# Patient Record
Sex: Male | Born: 1990 | Race: White | Hispanic: No | Marital: Married | Smoking: Current every day smoker
Health system: Southern US, Community
[De-identification: ages and names within clinical notes are randomized; demographics above are authoritative.]

## PROBLEM LIST (undated history)

## (undated) HISTORY — PX: TONSILLECTOMY AND ADENOIDECTOMY: SUR1326

---
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
## 2012-07-26 ENCOUNTER — Emergency Department: Payer: Self-pay | Admitting: Emergency Medicine

## 2012-07-26 LAB — COMPREHENSIVE METABOLIC PANEL
Alkaline Phosphatase: 71 U/L (ref 50–136)
Anion Gap: 5 — ABNORMAL LOW (ref 7–16)
BUN: 14 mg/dL (ref 7–18)
Bilirubin,Total: 0.4 mg/dL (ref 0.2–1.0)
Chloride: 105 mmol/L (ref 98–107)
Co2: 29 mmol/L (ref 21–32)
Creatinine: 0.97 mg/dL (ref 0.60–1.30)
EGFR (African American): >60
EGFR (Non-African Amer.): >60
SGPT (ALT): 47 U/L (ref 12–78)

## 2012-07-26 LAB — CBC
HCT: 44.5 % (ref 40.0–52.0)
MCH: 31.8 pg (ref 26.0–34.0)
MCHC: 36.1 g/dL — ABNORMAL HIGH (ref 32.0–36.0)
MCV: 88 fL (ref 80–100)
Platelet: 171 10*3/uL (ref 150–440)
RBC: 5.05 10*6/uL (ref 4.40–5.90)
RDW: 12.7 % (ref 11.5–14.5)

## 2014-09-21 ENCOUNTER — Ambulatory Visit: Payer: Self-pay | Admitting: Family Medicine

## 2016-01-19 ENCOUNTER — Encounter: Payer: Self-pay | Admitting: Family Medicine

## 2016-01-19 ENCOUNTER — Ambulatory Visit (INDEPENDENT_AMBULATORY_CARE_PROVIDER_SITE_OTHER): Payer: BLUE CROSS/BLUE SHIELD | Admitting: Family Medicine

## 2016-01-19 VITALS — BP 124/88 | HR 90 | Temp 99.1°F | Resp 18 | Wt 290.0 lb

## 2016-01-19 DIAGNOSIS — R05 Cough: Secondary | ICD-10-CM

## 2016-01-19 DIAGNOSIS — R12 Heartburn: Secondary | ICD-10-CM | POA: Insufficient documentation

## 2016-01-19 DIAGNOSIS — R110 Nausea: Secondary | ICD-10-CM

## 2016-01-19 DIAGNOSIS — R509 Fever, unspecified: Secondary | ICD-10-CM | POA: Diagnosis not present

## 2016-01-19 DIAGNOSIS — G4710 Hypersomnia, unspecified: Secondary | ICD-10-CM | POA: Insufficient documentation

## 2016-01-19 DIAGNOSIS — R1013 Epigastric pain: Secondary | ICD-10-CM | POA: Insufficient documentation

## 2016-01-19 DIAGNOSIS — R059 Cough, unspecified: Secondary | ICD-10-CM

## 2016-01-19 DIAGNOSIS — R454 Irritability and anger: Secondary | ICD-10-CM | POA: Insufficient documentation

## 2016-01-19 DIAGNOSIS — Z720 Tobacco use: Secondary | ICD-10-CM | POA: Insufficient documentation

## 2016-01-19 MED ORDER — PROMETHAZINE-DM 6.25-15 MG/5ML PO SYRP: 5.0000 mL | ORAL_SOLUTION | Freq: Four times a day (QID) | ORAL | Status: DC | PRN

## 2016-01-19 MED ORDER — OSELTAMIVIR PHOSPHATE 75 MG PO CAPS: 75.0000 mg | ORAL_CAPSULE | Freq: Two times a day (BID) | ORAL | Status: DC

## 2016-01-19 NOTE — Patient Instructions (Signed)

## 2016-01-19 NOTE — Progress Notes (Signed)
Patient ID: Cameron Guzman, male   DOB: [birth year], 25 y.o.   MRN: [id number]       Patient: Cameron Guzman Male    DOB: 03/31/1991   25 y.o.   MRN: [id number] Visit Date: 01/19/2016  Today's Provider: Vernie Murders, PA   Chief Complaint  Patient presents with  . Fever  . Generalized Body Aches   Subjective:    HPI  Patient states he has not felt good for 2 days now. Symptoms include: sore throat, headache, fever of 102 at home, body aches, nausea. He has been around someone who has the flu yesterday.    Patient Active Problem List   Diagnosis Date Noted  . Heart burn 01/19/2016  . Hypersomnolence 01/19/2016  . Dyspepsia 01/19/2016  . Irritability and anger 01/19/2016  . Tobacco use 01/19/2016   Past Surgical History  Procedure Laterality Date  . Tonsillectomy and adenoidectomy     Family History  Problem Relation Age of Onset  . Anxiety disorder Mother   . Depression Mother   . Heart disease Father   . Hypertension Father     No Known Allergies   Previous Medications   No medications on file   Review of Systems  Constitutional: Positive for fever and fatigue.  HENT: Positive for congestion, sore throat and voice change.   Respiratory: Positive for cough.   Gastrointestinal: Positive for nausea. Negative for vomiting and diarrhea.  Neurological: Positive for headaches.   Social History  Substance Use Topics  . Smoking status: Current Every Day Smoker -- 1.00 packs/day for 13 years    Types: Cigarettes  . Smokeless tobacco: Never Used  . Alcohol Use: No   Objective:   BP 124/88 mmHg  Pulse 90  Temp(Src) 99.1 F (37.3 C)  Resp 18  Wt 290 lb (131.543 kg)  SpO2 98%  Physical Exam  Constitutional: He is oriented to person, place, and time. He appears well-developed and well-nourished. No distress.  HENT:  Head: Normocephalic and atraumatic.  Right Ear: Hearing and external ear normal.  Left Ear: Hearing and external ear normal.  Nose: Nose  normal.  Mouth/Throat: Oropharynx is clear and moist.  Eyes: Conjunctivae, EOM and lids are normal. Right eye exhibits no discharge. Left eye exhibits no discharge. No scleral icterus.  Neck: Normal range of motion. Neck supple.  Cardiovascular: Normal rate, regular rhythm and normal heart sounds.   Pulmonary/Chest: Effort normal and breath sounds normal. No respiratory distress.  Abdominal: Soft. Bowel sounds are normal.  Musculoskeletal: Normal range of motion.  Neurological: He is alert and oriented to person, place, and time.  Skin: Skin is intact. No lesion and no rash noted.  Psychiatric: He has a normal mood and affect. His speech is normal and behavior is normal. Thought content normal.      Assessment & Plan:      1. Other specified fever Onset over the past couple days. Very concerned about exposing daughter to this (30 1/2 year old with history of RSV hospitalization last year). Flu test negative. With exposure to someone with the influenza yesterday, will give Tamiflu. Encouraged to drink extra fluids, use Tylenol or Advil prn and home to rest. Recheck prn. - POCT Influenza A/B - oseltamivir (TAMIFLU) 75 MG capsule; Take 1 capsule (75 mg total) by mouth 2 (two) times daily.  Dispense: 10 capsule; Refill: 0  2. Nausea No vomiting but some cramps and nausea this morning. No diarrhea.  3. Cough Mild cough today without  sputum production. Some scratchy sore throat. Will treat with cough syrup and recheck as needed. - promethazine-dextromethorphan (PROMETHAZINE-DM) 6.25-15 MG/5ML syrup; Take 5 mLs by mouth 4 (four) times daily as needed for cough.  Dispense: 118 mL; Refill: 0       Vernie Murders, PA  [facility]

## 2016-08-16 ENCOUNTER — Emergency Department
Admission: EM | Admit: 2016-08-16 | Discharge: 2016-08-16 | Disposition: A | Discharge status: Home / Self Care | Payer: Medicaid Other | Attending: Emergency Medicine | Admitting: Emergency Medicine

## 2016-08-16 ENCOUNTER — Telehealth: Payer: Self-pay | Admitting: Physician Assistant

## 2016-08-16 ENCOUNTER — Encounter: Payer: Self-pay | Admitting: Emergency Medicine

## 2016-08-16 ENCOUNTER — Emergency Department: Payer: Medicaid Other

## 2016-08-16 DIAGNOSIS — R197 Diarrhea, unspecified: Secondary | ICD-10-CM | POA: Diagnosis not present

## 2016-08-16 DIAGNOSIS — R21 Rash and other nonspecific skin eruption: Secondary | ICD-10-CM | POA: Insufficient documentation

## 2016-08-16 DIAGNOSIS — Z79899 Other long term (current) drug therapy: Secondary | ICD-10-CM | POA: Diagnosis not present

## 2016-08-16 DIAGNOSIS — R1084 Generalized abdominal pain: Secondary | ICD-10-CM | POA: Diagnosis not present

## 2016-08-16 DIAGNOSIS — R112 Nausea with vomiting, unspecified: Secondary | ICD-10-CM | POA: Diagnosis not present

## 2016-08-16 DIAGNOSIS — F17210 Nicotine dependence, cigarettes, uncomplicated: Secondary | ICD-10-CM | POA: Diagnosis not present

## 2016-08-16 LAB — COMPREHENSIVE METABOLIC PANEL
ALK PHOS: 52 U/L (ref 38–126)
ALT: 67 U/L — AB (ref 17–63)
AST: 33 U/L (ref 15–41)
Albumin: 4.1 g/dL (ref 3.5–5.0)
Anion gap: 6 (ref 5–15)
BILIRUBIN TOTAL: 0.4 mg/dL (ref 0.3–1.2)
BUN: 17 mg/dL (ref 6–20)
CALCIUM: 9.6 mg/dL (ref 8.9–10.3)
CO2: 22 mmol/L (ref 22–32)
CREATININE: 0.89 mg/dL (ref 0.61–1.24)
Chloride: 107 mmol/L (ref 101–111)
Glucose, Bld: 115 mg/dL — ABNORMAL HIGH (ref 65–99)
Potassium: 4.1 mmol/L (ref 3.5–5.1)
SODIUM: 135 mmol/L (ref 135–145)
Total Protein: 7.3 g/dL (ref 6.5–8.1)

## 2016-08-16 LAB — URINALYSIS COMPLETE WITH MICROSCOPIC (ARMC ONLY)
Bilirubin Urine: NEGATIVE
Glucose, UA: NEGATIVE mg/dL
KETONES UR: NEGATIVE mg/dL
Leukocytes, UA: NEGATIVE
Nitrite: NEGATIVE
PROTEIN: NEGATIVE mg/dL
Specific Gravity, Urine: 1.02 (ref 1.005–1.030)
Squamous Epithelial / LPF: NONE SEEN
pH: 5 (ref 5.0–8.0)

## 2016-08-16 LAB — LIPASE, BLOOD: Lipase: 13 U/L (ref 11–51)

## 2016-08-16 LAB — CBC WITH DIFFERENTIAL/PLATELET
BASOS PCT: 0 %
Basophils Absolute: 0.1 10*3/uL (ref 0–0.1)
EOS ABS: 0.1 10*3/uL (ref 0–0.7)
Eosinophils Relative: 1 %
HCT: 47.9 % (ref 40.0–52.0)
Hemoglobin: 16.7 g/dL (ref 13.0–18.0)
Lymphocytes Relative: 10 %
Lymphs Abs: 2.2 10*3/uL (ref 1.0–3.6)
MCH: 30.1 pg (ref 26.0–34.0)
MCHC: 34.9 g/dL (ref 32.0–36.0)
MCV: 86.3 fL (ref 80.0–100.0)
MONO ABS: 0.9 10*3/uL (ref 0.2–1.0)
Monocytes Relative: 4 %
Neutro Abs: 18.6 10*3/uL — ABNORMAL HIGH (ref 1.4–6.5)
Neutrophils Relative %: 85 %
PLATELETS: 216 10*3/uL (ref 150–440)
RBC: 5.55 MIL/uL (ref 4.40–5.90)
RDW: 12.7 % (ref 11.5–14.5)
WBC: 21.8 10*3/uL — ABNORMAL HIGH (ref 3.8–10.6)

## 2016-08-16 MED ORDER — ONDANSETRON HCL 4 MG/2ML IJ SOLN
4.0000 mg | Freq: Once | INTRAMUSCULAR | Status: AC
  Administered 2016-08-16: 4 mg via INTRAVENOUS
  Filled 2016-08-16: qty 2

## 2016-08-16 MED ORDER — IOPAMIDOL (ISOVUE-300) INJECTION 61%
30.0000 mL | Freq: Once | INTRAVENOUS | Status: AC | PRN
  Administered 2016-08-16: 30 mL via ORAL

## 2016-08-16 MED ORDER — DIPHENHYDRAMINE HCL 50 MG/ML IJ SOLN
25.0000 mg | Freq: Once | INTRAMUSCULAR | Status: AC
  Administered 2016-08-16: 25 mg via INTRAVENOUS
  Filled 2016-08-16: qty 1

## 2016-08-16 MED ORDER — PREDNISONE 50 MG PO TABS: 50.0000 mg | ORAL_TABLET | Freq: Every day | ORAL | 0 refills | Status: DC

## 2016-08-16 MED ORDER — METHYLPREDNISOLONE SODIUM SUCC 125 MG IJ SOLR
125.0000 mg | Freq: Once | INTRAMUSCULAR | Status: AC
  Administered 2016-08-16: 125 mg via INTRAVENOUS
  Filled 2016-08-16: qty 2

## 2016-08-16 MED ORDER — ONDANSETRON 4 MG PO TBDP: ORAL_TABLET | ORAL | 0 refills | Status: AC

## 2016-08-16 MED ORDER — IOPAMIDOL (ISOVUE-300) INJECTION 61%
100.0000 mL | Freq: Once | INTRAVENOUS | Status: AC | PRN
  Administered 2016-08-16: 100 mL via INTRAVENOUS

## 2016-08-16 NOTE — Discharge Instructions (Signed)
Stay hydrated.  Take zofran as needed for nausea.   Take prednisone 50 mg daily for 5 days.   Continue benadryl as needed for itchiness from the rash   See your doctor  Return to ER if you have worse abdominal pain, vomiting, fevers, dehydration, rash, trouble breathing

## 2016-08-16 NOTE — ED Provider Notes (Signed)
[facility] Provider Note   CSN: [id number] Arrival date & time: 08/16/16  0708     History   Chief Complaint Chief Complaint  Patient presents with  . Rash  . Abdominal Pain  . Emesis    HPI Cameron Guzman is a 25 y.o. male otherwise healthy here with rash, abdominal pain. He states that he had an episode of rash about a week ago that resolved with Benadryl. Woke up last night around 2 AM and developed rash starting from his toes now spreads throughout his body. So states that his fingers are more swollen as well as he has some lip swelling. His of subjective shortness of breath as well but denies any chest pain or any trouble swallowing or trouble speaking. He states that he had some shrimp yesterday but he had seafood previously with no reaction. Denies any new meds or new shampoos or detergents. Denies any sick contacts. Denies any recent travel. This morning had some diffuse abdominal pain and an episode of vomiting and diarrhea   The history is provided by the patient.    History reviewed. No pertinent past medical history.  Patient Active Problem List   Diagnosis Date Noted  . Heart burn 01/19/2016  . Hypersomnolence 01/19/2016  . Dyspepsia 01/19/2016  . Irritability and anger 01/19/2016  . Tobacco use 01/19/2016    Past Surgical History:  Procedure Laterality Date  . TONSILLECTOMY AND ADENOIDECTOMY         Home Medications    Prior to Admission medications   Medication Sig Start Date End Date Taking? Authorizing Provider  oseltamivir (TAMIFLU) 75 MG capsule Take 1 capsule (75 mg total) by mouth 2 (two) times daily. 01/19/16   Vickki Muff Chrismon, PA  promethazine-dextromethorphan (PROMETHAZINE-DM) 6.25-15 MG/5ML syrup Take 5 mLs by mouth 4 (four) times daily as needed for cough. 01/19/16   Margo Common, PA    Family History Family History  Problem Relation Age of Onset  . Anxiety disorder Mother   . Depression Mother   . Heart disease  Father   . Hypertension Father     Social History Social History  Substance Use Topics  . Smoking status: Current Every Guzman Smoker    Packs/Guzman: 1.00    Years: 13.00    Types: Cigarettes  . Smokeless tobacco: Never Used  . Alcohol use No     Allergies   Review of patient's allergies indicates no known allergies.   Review of Systems Review of Systems  Gastrointestinal: Positive for abdominal pain, diarrhea, nausea and vomiting.  Skin: Positive for rash.  All other systems reviewed and are negative.    Physical Exam Updated Vital Signs BP 116/62   Pulse 81   Temp 98.1 F (36.7 C) (Oral)   Resp (!) 26   Ht 6' (1.829 m)   Wt 280 lb (127 kg)   SpO2 94%   BMI 37.97 kg/m   Physical Exam  Constitutional: He is oriented to person, place, and time.  Slightly anxious   HENT:  Head: Normocephalic.  No obvious lip or tongue swelling, posterior pharynx nl   Eyes: EOM are normal. Pupils are equal, round, and reactive to light.  Neck: Normal range of motion. Neck supple.  No stridor   Cardiovascular: Normal rate, regular rhythm and normal heart sounds.   Pulmonary/Chest: Effort normal and breath sounds normal. No respiratory distress. He has no wheezes.  Abdominal: Soft. Bowel sounds are normal. He exhibits no distension. There is no  tenderness.  Musculoskeletal: Normal range of motion.  Neurological: He is alert and oriented to person, place, and time.  Skin:  Faint macular papular rash on torso and extremities. No obvious petechiae or purpura.   Nursing note and vitals reviewed.    ED Treatments / Results  Labs (all labs ordered are listed, but only abnormal results are displayed) Labs Reviewed  CBC WITH DIFFERENTIAL/PLATELET - Abnormal; Notable for the following:       Result Value   WBC 21.8 (*)    Neutro Abs 18.6 (*)    All other components within normal limits  COMPREHENSIVE METABOLIC PANEL - Abnormal; Notable for the following:    Glucose, Bld 115 (*)     ALT 67 (*)    All other components within normal limits  URINALYSIS COMPLETEWITH MICROSCOPIC ([facility] ONLY) - Abnormal; Notable for the following:    Color, Urine YELLOW (*)    APPearance CLEAR (*)    Hgb urine dipstick 1+ (*)    Bacteria, UA RARE (*)    All other components within normal limits  LIPASE, BLOOD    EKG  EKG Interpretation None      .ED ECG REPORT I, Wandra Arthurs, the attending physician, personally viewed and interpreted this ECG.   Date: 08/16/2016  EKG Time: 7:47 am  Rate: 90  Rhythm: normal EKG, normal sinus rhythm, unchanged from previous tracings  Axis: normal  Intervals:none  ST&T Change: nonspecific   Radiology No results found.  Procedures Procedures (including critical care time)  Medications Ordered in ED Medications  ondansetron (ZOFRAN) injection 4 mg (4 mg Intravenous Given 08/16/16 0735)  methylPREDNISolone sodium succinate (SOLU-MEDROL) 125 mg/2 mL injection 125 mg (125 mg Intravenous Given 08/16/16 0735)  diphenhydrAMINE (BENADRYL) injection 25 mg (25 mg Intravenous Given 08/16/16 0735)  iopamidol (ISOVUE-300) 61 % injection 30 mL (30 mLs Oral Contrast Given 08/16/16 1010)  iopamidol (ISOVUE-300) 61 % injection 100 mL (100 mLs Intravenous Contrast Given 08/16/16 1054)     Initial Impression / Assessment and Plan / ED Course  I have reviewed the triage vital signs and the nursing notes.  Pertinent labs & imaging results that were available during my care of the patient were reviewed by me and considered in my medical decision making (see chart for details).  Clinical Course  Cameron Guzman is a 25 y.o. male here with rash, vomiting, diarrhea. I think likely gastro. Rash can be associated with vs viral [place]. Has no signs of anaphylaxis. Will check labs, UA. Will give steroids, benadryl. Will reassess.   12:50 PM WBC 21. Mild diffuse tenderness, no rebound. CT showed no acute changes, has some hepatic steatosis. The radiology read is  visible in PACS but didn't cross over to [facility] but I was able to review the read and the images. Pain improved. Rash improved. Likely viral gastro with rash. Since steroids helped with rash, will give course of steroids, prn zofran for nausea.   Final Clinical Impressions(s) / ED Diagnoses   Final diagnoses:  None    New Prescriptions New Prescriptions   No medications on file     Drenda Freeze, MD 08/16/16 1252

## 2016-08-16 NOTE — ED Notes (Signed)
Pt's wife updated about waiting for ct results, cont to monitor

## 2016-08-16 NOTE — ED Notes (Signed)
Pt is drinking the ct contrast, states that "it tastes terrible" no distress noted, cont to monitor

## 2016-08-16 NOTE — Telephone Encounter (Signed)
Pt is being seen in the ER now for a rash, etc.    His mother Jowel Waltner, who is now your patient) just called stating the nurses have told him he needed to have a sleep study done because when he is falling asleep in the ER his "respiratory stats are dropping" and making the machine alerts go off.   He has not been to our office in 4 yrs or so.  Will you see him to get him re-established so he can have a sleep study done?

## 2016-08-16 NOTE — Telephone Encounter (Signed)
Yes he can be scheduled at my next open NP/CPE slot

## 2016-08-16 NOTE — ED Triage Notes (Signed)
Pt reports woke up with rash this AM. Also c/o generalized abdominal pain and vomiting. Reports he feels short of breath.

## 2016-08-16 NOTE — ED Notes (Signed)
Pt states that he never was able to sleep last night, pt states he was itching all over, took a benadryl at 0200 for itching and hives he has all over

## 2016-08-16 NOTE — ED Notes (Signed)
Pt resting quietly, no distress, cont to monitor, Dr Darl Householder notified pt hoping for an update soon

## 2016-08-19 ENCOUNTER — Ambulatory Visit: Payer: BLUE CROSS/BLUE SHIELD | Admitting: Family Medicine

## 2016-08-19 DIAGNOSIS — F17200 Nicotine dependence, unspecified, uncomplicated: Secondary | ICD-10-CM | POA: Insufficient documentation

## 2016-08-19 DIAGNOSIS — R0683 Snoring: Secondary | ICD-10-CM | POA: Insufficient documentation

## 2016-08-26 ENCOUNTER — Ambulatory Visit (INDEPENDENT_AMBULATORY_CARE_PROVIDER_SITE_OTHER): Payer: Medicaid Other | Admitting: Physician Assistant

## 2016-08-26 ENCOUNTER — Encounter: Payer: Self-pay | Admitting: Physician Assistant

## 2016-08-26 VITALS — BP 118/78 | HR 82 | Temp 98.2°F | Resp 16 | Wt 286.2 lb

## 2016-08-26 DIAGNOSIS — D72829 Elevated white blood cell count, unspecified: Secondary | ICD-10-CM

## 2016-08-26 DIAGNOSIS — Z23 Encounter for immunization: Secondary | ICD-10-CM | POA: Diagnosis not present

## 2016-08-26 DIAGNOSIS — T7840XA Allergy, unspecified, initial encounter: Secondary | ICD-10-CM

## 2016-08-26 DIAGNOSIS — R0683 Snoring: Secondary | ICD-10-CM

## 2016-08-26 DIAGNOSIS — R400 Somnolence: Secondary | ICD-10-CM | POA: Diagnosis not present

## 2016-08-26 DIAGNOSIS — Z6838 Body mass index (BMI) 38.0-38.9, adult: Secondary | ICD-10-CM | POA: Diagnosis not present

## 2016-08-26 NOTE — Progress Notes (Signed)
Patient: Cameron Guzman Male    DOB: [birth year]   25 y.o.   MRN: [id number] Visit Date: 08/26/2016  Today's Provider: Mar Daring, PA-C   Chief Complaint  Patient presents with  . Sleep Apnea   Subjective:    HPI Patient is here with c/o sleep apnea. He reports he had a sleep study in the past. He gets up to gasp for air. Snores a lot. He was just at the hospital for other complain and he reports his wife and mother notice his breathing was changing while he was sleeping. No hx of asthma. He has had one sleep study in the past that was inconclusive because he could not sleep well. His wife reports that this was about 1.5 years ago. He snores very loudly (lives in townhome and neighbors hit the wall because of his snoring), wife reports witnessed apneas, stating she notices him not breathing and she will nudge him. He also states he sleeps very hard now and has difficulty waking. He has 3 alarms set that go off every 5 min that he sleeps through. He also has had increasing daytime somnolence over the past 6 months. He reports if he is sitting still he can fall alseep even if someone is talking to him. He has had tonsillectomy and adenoidectomy.  He wants a referral to allergist. He thinks he may have a red meat allergy from tick bite. He states that a few weeks ago he had red meat often for dinner and he started waking up in the middle of the night with a pruritic rash, but he would take benadryl and it would improve. Finally on the night/morning of 08/16/16 he developed a rash that did not respond to benadryl. He went to the ER. He was told he may be having an allergic reaction vs viral gastroenteritis with viral exanthem. Patient has avoided red meat since and has not experienced these symptoms since then.     No Known Allergies   Current Outpatient Prescriptions:  .  ondansetron (ZOFRAN ODT) 4 MG disintegrating tablet, 43m ODT q4 hours prn nausea/vomit (Patient not taking:  Reported on 08/26/2016), Disp: 8 tablet, Rfl: 0 .  predniSONE (DELTASONE) 50 MG tablet, Take 50 mg by mouth daily., Disp: , Rfl: 0  Review of Systems  Constitutional: Positive for fatigue.  HENT: Negative for congestion, nosebleeds, postnasal drip, rhinorrhea, sinus pressure, sneezing, sore throat and trouble swallowing.   Respiratory: Positive for apnea. Negative for cough, choking, chest tightness, shortness of breath and wheezing.   Cardiovascular: Negative for chest pain, palpitations and leg swelling.  Gastrointestinal: Negative for abdominal pain.  Allergic/Immunologic: Positive for food allergies (questioning).  Neurological: Negative for dizziness, weakness, light-headedness, numbness and headaches.  Psychiatric/Behavioral: Positive for sleep disturbance.    Social History  Substance Use Topics  . Smoking status: Current Every Day Smoker    Packs/day: 1.00    Years: 13.00    Types: Cigarettes  . Smokeless tobacco: Never Used  . Alcohol use No   Objective:   BP 118/78 (BP Location: Right Arm, Patient Position: Sitting, Cuff Size: Large)   Pulse 82   Temp 98.2 F (36.8 C) (Oral)   Resp 16   Wt 286 lb 3.2 oz (129.8 kg)   SpO2 97%   BMI 38.82 kg/m   Physical Exam  Constitutional: He is oriented to person, place, and time. He appears well-developed and well-nourished. No distress.  HENT:  Head: Normocephalic and atraumatic.  Right Ear: Hearing, tympanic membrane, external ear and ear canal normal.  Left Ear: Hearing, tympanic membrane, external ear and ear canal normal.  Nose: Nose normal.  Mouth/Throat: Uvula is midline, oropharynx is clear and moist and mucous membranes are normal. No oropharyngeal exudate.  Eyes: Conjunctivae and EOM are normal. Pupils are equal, round, and reactive to light. Right eye exhibits no discharge. Left eye exhibits no discharge.  Neck: Normal range of motion. Neck supple. No JVD present. No tracheal deviation present. No Brudzinski's sign  and no Kernig's sign noted. No thyromegaly present.  Cardiovascular: Normal rate, regular rhythm and normal heart sounds.  Exam reveals no gallop and no friction rub.   No murmur heard. Pulmonary/Chest: Effort normal and breath sounds normal. No stridor. No respiratory distress. He has no wheezes. He has no rales. He exhibits no tenderness.  Lymphadenopathy:    He has no cervical adenopathy.  Neurological: He is alert and oriented to person, place, and time.  Skin: Skin is warm and dry.  Psychiatric: He has a normal mood and affect. His behavior is normal. Judgment and thought content normal.        Assessment & Plan:     1. Allergic reaction, initial encounter Will refer patient to allergist for testing. Patient thinks it may be red meat allergy from ticks. 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 He is requesting to be sent to [facility]NT with Dr. Richardson Landry. - Ambulatory referral to Allergy  2. Snoring Will attempt to get sleep study scheduled to further evaluate for obstructive sleep apnea. He does have obesity, snoring, witnessed apnea and daytime somnolence.  - Ambulatory referral to Sleep Studies  3. BMI 38.0-38.9,adult See above medical treatment plan. - Ambulatory referral to Sleep Studies  4. Daytime somnolence See above medical treatment plan. - Ambulatory referral to Sleep Studies  5. Leukocytosis, unspecified type Had leukocytosis on ER visit with allergic response. Will recheck to make sure return back to normal.  - CBC with Differential  6. Need for influenza vaccination Flu vaccine given today without complication. Patient sat upright for 15 minutes to check for adverse reaction before being released. - Flu Vaccine QUAD 36+ mos PF IM (Fluarix & Fluzone Quad PF)       Mar Daring, PA-C  [facility]

## 2016-08-26 NOTE — Patient Instructions (Signed)
Sleep Apnea  Sleep apnea is a sleep disorder characterized by abnormal pauses in breathing while you sleep. When your breathing pauses, the level of oxygen in your blood decreases. This causes you to move out of deep sleep and into light sleep. As a result, your quality of sleep is poor, and the system that carries your blood throughout your body (cardiovascular system) experiences stress. If sleep apnea remains untreated, the following conditions can develop:  High blood pressure (hypertension).  Coronary artery disease.  Inability to achieve or maintain an erection (impotence).  Impairment of your thought process (cognitive dysfunction). There are three types of sleep apnea: 1. Obstructive sleep apnea--Pauses in breathing during sleep because of a blocked airway. 2. Central sleep apnea--Pauses in breathing during sleep because the area of the brain that controls your breathing does not send the correct signals to the muscles that control breathing. 3. Mixed sleep apnea--A combination of both obstructive and central sleep apnea. RISK FACTORS The following risk factors can increase your risk of developing sleep apnea:  Being overweight.  Smoking.  Having narrow passages in your nose and throat.  Being of older age.  Being male.  Alcohol use.  Sedative and tranquilizer use.  Ethnicity. Among individuals younger than 35 years, African Americans are at increased risk of sleep apnea. SYMPTOMS   Difficulty staying asleep.  Daytime sleepiness and fatigue.  Loss of energy.  Irritability.  Loud, heavy snoring.  Morning headaches.  Trouble concentrating.  Forgetfulness.  Decreased interest in sex.  Unexplained sleepiness. DIAGNOSIS  In order to diagnose sleep apnea, your caregiver will perform a physical examination. A sleep study done in the comfort of your own home may be appropriate if you are otherwise healthy. Your caregiver may also recommend that you spend the  night in a sleep lab. In the sleep lab, several monitors record information about your heart, lungs, and brain while you sleep. Your leg and arm movements and blood oxygen level are also recorded. TREATMENT The following actions may help to resolve mild sleep apnea:  Sleeping on your side.   Using a decongestant if you have nasal congestion.   Avoiding the use of depressants, including alcohol, sedatives, and narcotics.   Losing weight and modifying your diet if you are overweight. There also are devices and treatments to help open your airway:  Oral appliances. These are custom-made mouthpieces that shift your lower jaw forward and slightly open your bite. This opens your airway.  Devices that create positive airway pressure. This positive pressure "splints" your airway open to help you breathe better during sleep. The following devices create positive airway pressure:  Continuous positive airway pressure (CPAP) device. The CPAP device creates a continuous level of air pressure with an air pump. The air is delivered to your airway through a mask while you sleep. This continuous pressure keeps your airway open.  Nasal expiratory positive airway pressure (EPAP) device. The EPAP device creates positive air pressure as you exhale. The device consists of single-use valves, which are inserted into each nostril and held in place by adhesive. The valves create very little resistance when you inhale but create much more resistance when you exhale. That increased resistance creates the positive airway pressure. This positive pressure while you exhale keeps your airway open, making it easier to breath when you inhale again.  Bilevel positive airway pressure (BPAP) device. The BPAP device is used mainly in patients with central sleep apnea. This device is similar to the CPAP device because   it also uses an air pump to deliver continuous air pressure through a mask. However, with the BPAP machine, the  pressure is set at two different levels. The pressure when you exhale is lower than the pressure when you inhale.  Surgery. Typically, surgery is only done if you cannot comply with less invasive treatments or if the less invasive treatments do not improve your condition. Surgery involves removing excess tissue in your airway to create a wider passage way.   This information is not intended to replace advice given to you by your health care provider. Make sure you discuss any questions you have with your health care provider.   Document Released: 11/01/2002 Document Revised: 12/02/2014 Document Reviewed: 03/19/2012 Elsevier Interactive Patient Education 2016 [employer] Inc.   

## 2016-08-28 ENCOUNTER — Telehealth: Payer: Self-pay

## 2016-08-28 LAB — CBC WITH DIFFERENTIAL/PLATELET
BASOS: 1 %
Basophils Absolute: 0.1 10*3/uL (ref 0.0–0.2)
EOS (ABSOLUTE): 0.2 10*3/uL (ref 0.0–0.4)
EOS: 2 %
HEMOGLOBIN: 16.2 g/dL (ref 12.6–17.7)
Hematocrit: 46.2 % (ref 37.5–51.0)
Immature Grans (Abs): 0 10*3/uL (ref 0.0–0.1)
Immature Granulocytes: 0 %
LYMPHS ABS: 2.9 10*3/uL (ref 0.7–3.1)
Lymphs: 30 %
MCH: 30.3 pg (ref 26.6–33.0)
MCHC: 35.1 g/dL (ref 31.5–35.7)
MCV: 87 fL (ref 79–97)
MONOS ABS: 0.6 10*3/uL (ref 0.1–0.9)
Monocytes: 7 %
Neutrophils Absolute: 5.9 10*3/uL (ref 1.4–7.0)
Neutrophils: 60 %
Platelets: 226 10*3/uL (ref 150–379)
RBC: 5.34 x10E6/uL (ref 4.14–5.80)
RDW: 13.3 % (ref 12.3–15.4)
WBC: 9.7 10*3/uL (ref 3.4–10.8)

## 2016-08-28 NOTE — Telephone Encounter (Signed)
-----   Message from Mar Daring, PA-C sent at 08/28/2016  9:23 AM EDT ----- WBC is now normal.

## 2016-08-28 NOTE — Telephone Encounter (Signed)
Patient advised as below.  

## 2016-09-19 ENCOUNTER — Ambulatory Visit: Payer: Medicaid Other | Attending: Otolaryngology

## 2016-09-19 DIAGNOSIS — I10 Essential (primary) hypertension: Secondary | ICD-10-CM | POA: Insufficient documentation

## 2016-09-19 DIAGNOSIS — E669 Obesity, unspecified: Secondary | ICD-10-CM | POA: Diagnosis not present

## 2016-09-19 DIAGNOSIS — G4710 Hypersomnia, unspecified: Secondary | ICD-10-CM | POA: Insufficient documentation

## 2016-09-19 DIAGNOSIS — R0683 Snoring: Secondary | ICD-10-CM | POA: Diagnosis present

## 2016-09-19 DIAGNOSIS — G478 Other sleep disorders: Secondary | ICD-10-CM | POA: Diagnosis not present

## 2016-09-19 DIAGNOSIS — Z6837 Body mass index (BMI) 37.0-37.9, adult: Secondary | ICD-10-CM | POA: Diagnosis not present

## 2016-10-11 ENCOUNTER — Telehealth: Payer: Self-pay | Admitting: Family Medicine

## 2016-10-11 DIAGNOSIS — J988 Other specified respiratory disorders: Secondary | ICD-10-CM

## 2016-10-11 NOTE — Telephone Encounter (Signed)
Is calling for results from his sleep study.  Its been about a month ago that he had it done,  Call back [phone]  [facility]

## 2016-10-11 NOTE — Telephone Encounter (Signed)
Have you received and reviewed over report yet? Please advise. KW

## 2016-10-11 NOTE — Telephone Encounter (Signed)
Referral placed.

## 2016-10-11 NOTE — Telephone Encounter (Signed)
Sleep study is fairly normal. Some apnea events but only when on back. Recommended to sleep on side. Also may require ENT eval to check for upper respiratory obstruction. Otherwise unremarkable. Let me know if patient would like to proceed with ENT referral.

## 2016-10-11 NOTE — Telephone Encounter (Signed)
Patient was advised, he would like for you to put in referral to ENT. KW

## 2016-10-11 NOTE — Telephone Encounter (Signed)
[id number] 

## 2017-08-07 ENCOUNTER — Encounter: Payer: Self-pay | Admitting: *Deleted

## 2017-08-07 ENCOUNTER — Emergency Department: Payer: Self-pay

## 2017-08-07 ENCOUNTER — Emergency Department
Admission: EM | Admit: 2017-08-07 | Discharge: 2017-08-07 | Disposition: A | Discharge status: Home / Self Care | Payer: Self-pay | Attending: Student in an Organized Health Care Education/Training Program | Admitting: Student in an Organized Health Care Education/Training Program

## 2017-08-07 DIAGNOSIS — S39012A Strain of muscle, fascia and tendon of lower back, initial encounter: Secondary | ICD-10-CM | POA: Insufficient documentation

## 2017-08-07 DIAGNOSIS — Y9389 Activity, other specified: Secondary | ICD-10-CM | POA: Insufficient documentation

## 2017-08-07 DIAGNOSIS — Y998 Other external cause status: Secondary | ICD-10-CM | POA: Insufficient documentation

## 2017-08-07 DIAGNOSIS — F17210 Nicotine dependence, cigarettes, uncomplicated: Secondary | ICD-10-CM | POA: Insufficient documentation

## 2017-08-07 DIAGNOSIS — Z79899 Other long term (current) drug therapy: Secondary | ICD-10-CM | POA: Insufficient documentation

## 2017-08-07 DIAGNOSIS — X500XXA Overexertion from strenuous movement or load, initial encounter: Secondary | ICD-10-CM | POA: Insufficient documentation

## 2017-08-07 DIAGNOSIS — Y929 Unspecified place or not applicable: Secondary | ICD-10-CM | POA: Insufficient documentation

## 2017-08-07 MED ORDER — OXYCODONE-ACETAMINOPHEN 5-325 MG PO TABS: 1.0000 | ORAL_TABLET | Freq: Four times a day (QID) | ORAL | 0 refills | Status: AC | PRN

## 2017-08-07 MED ORDER — ORPHENADRINE CITRATE 30 MG/ML IJ SOLN
60.0000 mg | Freq: Two times a day (BID) | INTRAMUSCULAR | Status: DC
  Administered 2017-08-07: 60 mg via INTRAMUSCULAR
  Filled 2017-08-07: qty 2

## 2017-08-07 MED ORDER — METHOCARBAMOL 1000 MG/10ML IJ SOLN: 1000.0000 mg | Freq: Once | INTRAMUSCULAR | Status: DC

## 2017-08-07 MED ORDER — NAPROXEN 500 MG PO TABS: 500.0000 mg | ORAL_TABLET | Freq: Two times a day (BID) | ORAL | 0 refills | Status: DC

## 2017-08-07 MED ORDER — OXYCODONE-ACETAMINOPHEN 5-325 MG PO TABS
2.0000 | ORAL_TABLET | Freq: Once | ORAL | Status: AC
  Administered 2017-08-07: 2 via ORAL
  Filled 2017-08-07: qty 2

## 2017-08-07 MED ORDER — CYCLOBENZAPRINE HCL 10 MG PO TABS: 10.0000 mg | ORAL_TABLET | Freq: Three times a day (TID) | ORAL | 0 refills | Status: AC

## 2017-08-07 NOTE — ED Triage Notes (Signed)
Pt reports low back pain starting yesterday, pt denies injury and other symptoms

## 2017-08-07 NOTE — Discharge Instructions (Signed)
Follow-up with your primary care doctor if any continued problems with your back. Begin taking medication only as directed. You may use ice or heat to your back as needed. Also use pillows under your knees as we discussed. Do not drive or operate machinery while taking muscle accident or pain medication.

## 2017-08-07 NOTE — ED Provider Notes (Signed)
[facility] Emergency Department Provider Note  ____________________________________________   First MD Initiated Contact with Patient 08/07/17 [phone]     (approximate)  I have reviewed the triage vital signs and the nursing notes.   HISTORY  Chief Complaint Back Pain   HPI Cameron Guzman is a 26 y.o. male is here with complaint of low back pain since yesterday. Patient states that he lifted yesterday and immediately felt pain in his back. Patient denies any prior back injuries. He denies any urinary symptoms or incontinence of bowel or bladder. He has taken Aleve 2 tablets this morning without any relief. Pain is increased with movement. He also complains of some radiation down to his legs. Patient is still ambulatory. He rates his pain as a 7 out of 10.  History reviewed. No pertinent past medical history.  There are no active problems to display for this patient.   History reviewed. No pertinent surgical history.  Prior to Admission medications   Medication Sig Start Date End Date Taking? Authorizing Provider  cyclobenzaprine (FLEXERIL) 10 MG tablet Take 1 tablet (10 mg total) by mouth 3 (three) times daily. 08/07/17   Johnn Hai, PA-C  naproxen (NAPROSYN) 500 MG tablet Take 1 tablet (500 mg total) by mouth 2 (two) times daily with a meal. 08/07/17   Johnn Hai, PA-C  oxyCODONE-acetaminophen (ROXICET) 5-325 MG tablet Take 1 tablet by mouth every 6 (six) hours as needed. 08/07/17 08/07/18  Johnn Hai, PA-C    Allergies Patient has no known allergies.  No family history on file.  Social History Social History  Substance Use Topics  . Smoking status: Current Every Day Smoker    Packs/day: 1.00    Types: Cigarettes  . Smokeless tobacco: Never Used  . Alcohol use No    Review of Systems Constitutional: No fever/chills Cardiovascular: Denies chest pain. Respiratory: Denies shortness of breath. Gastrointestinal: No abdominal  pain.  No nausea, no vomiting.   Genitourinary: Negative for dysuria. Musculoskeletal: Positive for back pain. Skin: Negative for rash. Neurological: Negative for headaches, focal weakness or numbness. ____________________________________________   PHYSICAL EXAM:  VITAL SIGNS: ED Triage Vitals  Enc Vitals Group     BP 08/07/17 0918 (!) 143/88     Pulse Rate 08/07/17 0918 76     Resp 08/07/17 0918 20     Temp 08/07/17 0918 98.2 F (36.8 C)     Temp Source 08/07/17 0918 Oral     SpO2 08/07/17 0918 98 %     Weight 08/07/17 0915 265 lb (120.2 kg)     Height 08/07/17 0915 5' 11"  (1.803 m)     Head Circumference --      Peak Flow --      Pain Score 08/07/17 0915 7     Pain Loc --      Pain Edu? --      Excl. in [place]? --    Constitutional: Alert and oriented. Well appearing and in no acute distress. Eyes: Conjunctivae are normal.  Head: Atraumatic. Nose: No congestion/rhinnorhea. Neck: No stridor.   Cardiovascular: Normal rate, regular rhythm. Grossly normal heart sounds.  Good peripheral circulation. Respiratory: Normal respiratory effort.  No retractions. Lungs CTAB. Gastrointestinal: Soft and nontender. No distention.  Musculoskeletal: Moderate tenderness on palpation of the lumbar spine at approximately L5-S1 area. No appreciated paravertebral muscle tenderness bilaterally. Range of motion does increase pain and guarding. Straight leg raises bilaterally are approximately 30 with pain. Neurologic:  Normal speech and  language. No gross focal neurologic deficits are appreciated. Reflexes are 2+ bilaterally. Skin:  Skin is warm, dry and intact. No rash noted. Psychiatric: Mood and affect are normal. Speech and behavior are normal.  ____________________________________________   LABS (all labs ordered are listed, but only abnormal results are displayed)  Labs Reviewed - No data to display  RADIOLOGY  Dg Lumbar Spine 2-3 Views  Result Date: 08/07/2017 CLINICAL DATA:  Pain  after bending EXAM: LUMBAR SPINE - 2-3 VIEW COMPARISON:  None. FINDINGS: Frontal, lateral, and spot lumbosacral lateral images were obtained. There are 4 non-rib-bearing lumbar type vertebral bodies. There is mild lumbar levoscoliosis. No fracture or spondylolisthesis. The disc spaces appear normal. No erosive change. IMPRESSION: Mild scoliosis. No fracture or dislocation. No evident arthropathy. Electronically Signed   By: Lowella Grip III M.D.   On: 08/07/2017 10:49    ____________________________________________   PROCEDURES  Procedure(s) performed: None  Procedures  Critical Care performed: No  ____________________________________________   INITIAL IMPRESSION / ASSESSMENT AND PLAN / ED COURSE  Pertinent labs & imaging results that were available during my care of the patient were reviewed by me and considered in my medical decision making (see chart for details).  We discussed x-ray findings with patient. He was discharged with prescription for naproxen 500 mg twice a day with food Percocet one every 6 hours as needed for severe pain and Flexeril 10 mg 3 times a day for muscle spasms. He is encouraged to use ice or heat to his back and also placed a pillow under his knees when lying on his back. Patient is follow-up with his PCP if any continued problems.   ___________________________________________   FINAL CLINICAL IMPRESSION(S) / ED DIAGNOSES  Final diagnoses:  Strain of lumbar region, initial encounter      NEW MEDICATIONS STARTED DURING THIS VISIT:  Discharge Medication List as of 08/07/2017 11:06 AM    START taking these medications   Details  cyclobenzaprine (FLEXERIL) 10 MG tablet Take 1 tablet (10 mg total) by mouth 3 (three) times daily., Starting Thu 08/07/2017, Print    naproxen (NAPROSYN) 500 MG tablet Take 1 tablet (500 mg total) by mouth 2 (two) times daily with a meal., Starting Thu 08/07/2017, Print    oxyCODONE-acetaminophen (ROXICET) 5-325 MG  tablet Take 1 tablet by mouth every 6 (six) hours as needed., Starting Thu 08/07/2017, Until Fri 08/07/2018, Print         Note:  This document was prepared using Dragon voice recognition software and may include unintentional dictation errors.    Johnn Hai, PA-C 08/07/17 1515    Merlyn Lot, MD 08/07/17 1538

## 2017-08-07 NOTE — ED Notes (Signed)
See triage note  Presents with lower back pain which is moving into both legs  States he bent to lift something and felt a pop to back   Per family he has had some numbness to arms prior to this injury  Ambulates slowly d/t pain

## 2017-08-08 ENCOUNTER — Encounter: Payer: Self-pay | Admitting: *Deleted

## 2017-09-22 ENCOUNTER — Emergency Department
Admission: EM | Admit: 2017-09-22 | Discharge: 2017-09-22 | Disposition: A | Discharge status: Home / Self Care | Payer: Self-pay | Attending: Emergency Medicine | Admitting: Emergency Medicine

## 2017-09-22 ENCOUNTER — Encounter: Payer: Self-pay | Admitting: Emergency Medicine

## 2017-09-22 ENCOUNTER — Emergency Department: Payer: Self-pay

## 2017-09-22 DIAGNOSIS — Z79899 Other long term (current) drug therapy: Secondary | ICD-10-CM | POA: Insufficient documentation

## 2017-09-22 DIAGNOSIS — R079 Chest pain, unspecified: Secondary | ICD-10-CM

## 2017-09-22 DIAGNOSIS — J189 Pneumonia, unspecified organism: Secondary | ICD-10-CM | POA: Insufficient documentation

## 2017-09-22 DIAGNOSIS — F17210 Nicotine dependence, cigarettes, uncomplicated: Secondary | ICD-10-CM | POA: Insufficient documentation

## 2017-09-22 LAB — CBC
HEMATOCRIT: 45 % (ref 40.0–52.0)
HEMOGLOBIN: 15.4 g/dL (ref 13.0–18.0)
MCH: 30.2 pg (ref 26.0–34.0)
MCHC: 34.2 g/dL (ref 32.0–36.0)
MCV: 88.1 fL (ref 80.0–100.0)
Platelets: 199 10*3/uL (ref 150–440)
RBC: 5.11 MIL/uL (ref 4.40–5.90)
RDW: 12.8 % (ref 11.5–14.5)
WBC: 10.6 10*3/uL (ref 3.8–10.6)

## 2017-09-22 LAB — BASIC METABOLIC PANEL
ANION GAP: 7 (ref 5–15)
BUN: 16 mg/dL (ref 6–20)
CHLORIDE: 102 mmol/L (ref 101–111)
CO2: 26 mmol/L (ref 22–32)
Calcium: 9.2 mg/dL (ref 8.9–10.3)
Creatinine, Ser: 1.09 mg/dL (ref 0.61–1.24)
GFR calc non Af Amer: >60 mL/min (ref >60)
Glucose, Bld: 99 mg/dL (ref 65–99)
POTASSIUM: 4.1 mmol/L (ref 3.5–5.1)
SODIUM: 135 mmol/L (ref 135–145)

## 2017-09-22 LAB — TROPONIN I: Troponin I: <0.03 ng/mL (ref <0.03)

## 2017-09-22 LAB — FIBRIN DERIVATIVES D-DIMER (ARMC ONLY): Fibrin derivatives D-dimer (ARMC): 434.94 ng/mL (FEU) (ref 0.00–499.00)

## 2017-09-22 MED ORDER — ALBUTEROL SULFATE HFA 108 (90 BASE) MCG/ACT IN AERS: 2.0000 | INHALATION_SPRAY | RESPIRATORY_TRACT | 0 refills | Status: AC | PRN

## 2017-09-22 MED ORDER — PREDNISONE 20 MG PO TABS: 40.0000 mg | ORAL_TABLET | Freq: Every day | ORAL | 0 refills | Status: AC

## 2017-09-22 MED ORDER — AZITHROMYCIN 500 MG PO TABS
500.0000 mg | ORAL_TABLET | Freq: Once | ORAL | Status: AC
  Administered 2017-09-22: 500 mg via ORAL
  Filled 2017-09-22: qty 1

## 2017-09-22 MED ORDER — NAPROXEN 500 MG PO TABS
500.0000 mg | ORAL_TABLET | Freq: Two times a day (BID) | ORAL | 0 refills | Status: AC
Start: 2017-09-22 — End: ?

## 2017-09-22 MED ORDER — KETOROLAC TROMETHAMINE 60 MG/2ML IM SOLN
15.0000 mg | Freq: Once | INTRAMUSCULAR | Status: AC
  Administered 2017-09-22: 15 mg via INTRAMUSCULAR
  Filled 2017-09-22: qty 2

## 2017-09-22 MED ORDER — PREDNISONE 20 MG PO TABS
40.0000 mg | ORAL_TABLET | ORAL | Status: AC
Start: 2017-09-22 — End: 2017-09-22
  Administered 2017-09-22: 40 mg via ORAL
  Filled 2017-09-22: qty 2

## 2017-09-22 MED ORDER — FAMOTIDINE 20 MG PO TABS
40.0000 mg | ORAL_TABLET | Freq: Once | ORAL | Status: AC
Start: 2017-09-22 — End: 2017-09-22
  Administered 2017-09-22: 40 mg via ORAL
  Filled 2017-09-22: qty 2

## 2017-09-22 MED ORDER — AZITHROMYCIN 250 MG PO TABS: ORAL_TABLET | ORAL | 0 refills | Status: AC

## 2017-09-22 NOTE — ED Notes (Signed)
Patient discharged to home per MD order. Patient in stable condition, and deemed medically cleared by ED provider for discharge. Discharge instructions reviewed with patient/family using "Teach Back"; verbalized understanding of medication education and administration, and information about follow-up care. Denies further concerns. ° °

## 2017-09-22 NOTE — ED Provider Notes (Signed)
[facility] Emergency Department Provider Note  ____________________________________________  Time seen: Approximately 11:06 PM  I have reviewed the triage vital signs and the nursing notes.   HISTORY  Chief Complaint Chest Pain    HPI Cameron Guzman is a 26 y.o. male who complains of chest pain that started today associated shortness of breath diaphoresis nausea, radiating to the back, hurts to breathe and hurts to move. It feels like tightness. Constant, no alleviating factors, moderate intensity.  No recent travel trauma hospitalizations or surgeries.   History reviewed. No pertinent past medical history.   Patient Active Problem List   Diagnosis Date Noted  . Snores 08/19/2016  . Compulsive tobacco user syndrome 08/19/2016  . Heart burn 01/19/2016  . Hypersomnolence 01/19/2016  . Dyspepsia 01/19/2016  . Irritability and anger 01/19/2016  . Tobacco use 01/19/2016     Past Surgical History:  Procedure Laterality Date  . TONSILLECTOMY AND ADENOIDECTOMY       Prior to Admission medications   Medication Sig Start Date End Date Taking? Authorizing Provider  albuterol (PROVENTIL HFA) 108 (90 Base) MCG/ACT inhaler Inhale 2 puffs into the lungs every 4 (four) hours as needed for wheezing or shortness of breath. 09/22/17   Carrie Mew, MD  azithromycin (ZITHROMAX Z-PAK) 250 MG tablet Take 2 tablets (500 mg) on  Day 1,  followed by 1 tablet (250 mg) once daily on Days 2 through 5. 09/22/17   Carrie Mew, MD  cyclobenzaprine (FLEXERIL) 10 MG tablet Take 1 tablet (10 mg total) by mouth 3 (three) times daily. 08/07/17   Johnn Hai, PA-C  naproxen (NAPROSYN) 500 MG tablet Take 1 tablet (500 mg total) by mouth 2 (two) times daily with a meal. 09/22/17   Carrie Mew, MD  ondansetron (ZOFRAN ODT) 4 MG disintegrating tablet 76m ODT q4 hours prn nausea/vomit Patient not taking: Reported on 08/26/2016 08/16/16   YDrenda Freeze MD   oxyCODONE-acetaminophen (ROXICET) 5-325 MG tablet Take 1 tablet by mouth every 6 (six) hours as needed. 08/07/17 08/07/18  SJohnn Hai PA-C  predniSONE (DELTASONE) 20 MG tablet Take 2 tablets (40 mg total) by mouth daily. 09/22/17   SCarrie Mew MD     Allergies Other   Family History  Problem Relation Age of Onset  . Anxiety disorder Mother   . Depression Mother   . Heart disease Father   . Hypertension Father     Social History Social History  Substance Use Topics  . Smoking status: Current Every Day Smoker    Packs/day: 1.00    Years: 13.00    Types: Cigarettes  . Smokeless tobacco: Never Used  . Alcohol use No    Review of Systems  Constitutional:   Positive fever and chills  ENT:   No sore throat. No rhinorrhea. Cardiovascular:  Positive as above chest pain without syncope. Respiratory:  Positive shortness of breath and nonproductive cough. Gastrointestinal:   Negative for abdominal pain, vomiting and diarrhea.  Musculoskeletal:   Negative for focal pain or swelling All other systems reviewed and are negative except as documented above in ROS and HPI.  ____________________________________________   PHYSICAL EXAM:  VITAL SIGNS: ED Triage Vitals  Enc Vitals Group     BP 09/22/17 1810 122/69     Pulse Rate 09/22/17 1810 96     Resp 09/22/17 1810 (!) 22     Temp 09/22/17 1810 99.9 F (37.7 C)     Temp Source 09/22/17 1810 Oral  SpO2 09/22/17 1810 96 %     Weight 09/22/17 1811 270 lb (122.5 kg)     Height 09/22/17 1811 5' 11"  (1.803 m)     Head Circumference --      Peak Flow --      Pain Score 09/22/17 1810 7     Pain Loc --      Pain Edu? --      Excl. in [place]? --     Vital signs reviewed, nursing assessments reviewed.   Constitutional:   Alert and oriented. Well appearing and in no distress. Eyes:   No scleral icterus.  EOMI. No nystagmus. No conjunctival pallor. PERRL. ENT   Head:   Normocephalic and atraumatic.   Nose:    No congestion/rhinnorhea.    Mouth/Throat:   MMM, no pharyngeal erythema. No peritonsillar mass.    Neck:   No meningismus. Full ROM. Hematological/Lymphatic/Immunilogical:   No cervical lymphadenopathy. Cardiovascular:   RRR. Symmetric bilateral radial and DP pulses.  No murmurs.  Respiratory:   Normal respiratory effort without tachypnea/retractions. Breath sounds are clear and equal bilaterally. No wheezes/rales/rhonchi. Gastrointestinal:   Soft and nontender. Non distended. There is no CVA tenderness.  No rebound, rigidity, or guarding. Genitourinary:   deferred Musculoskeletal:   Normal range of motion in all extremities. No joint effusions.  No lower extremity tenderness.  No edema. Neurologic:   Normal speech and language.  Motor grossly intact. No gross focal neurologic deficits are appreciated.  Skin:    Skin is warm, dry and intact. No rash noted.  No petechiae, purpura, or bullae.  ____________________________________________    LABS (pertinent positives/negatives) (all labs ordered are listed, but only abnormal results are displayed) Labs Reviewed  BASIC METABOLIC PANEL  CBC  TROPONIN I  FIBRIN DERIVATIVES D-DIMER ([facility] ONLY)   ____________________________________________   EKG  Interpreted by me  Date: 09/22/2017  Rate: 93  Rhythm: normal sinus rhythm  QRS Axis: normal  Intervals: normal  ST/T Wave abnormalities: normal  Conduction Disutrbances: none  Narrative Interpretation: unremarkable      ____________________________________________    RADIOLOGY  Dg Chest 2 View  Result Date: 09/22/2017 CLINICAL DATA:  Left-sided chest pain radiating to the left arm. Current smoker. Dyspnea. EXAM: CHEST  2 VIEW COMPARISON:  None. FINDINGS: Heart is top-normal in size. No aortic aneurysm. Mild eventration of right hemidiaphragm. Atelectasis is noted at each lung base. Superimposed right middle lobe pneumonia would be difficult to entirely exclude given  increased density in the right middle lobe distribution on the lateral view. However, most of the opacity is believed to be secondary to eventration of the right hemidiaphragm. No effusion nor overt pulmonary edema. Mild increase in interstitial prominence bilaterally may reflect chronic bronchitic change. No acute nor suspicious osseous abnormality. IMPRESSION: 1. Increased interstitial lung markings may reflect chronic bronchitic change. 2. Bibasilar atelectasis is noted right greater than left. A superimposed right middle lobe pneumonia would be difficult to entirely exclude given relative increase in opacity on the lateral view. Most of this however is believed to be due to eventration of the diaphragm. Electronically Signed   By: Ashley Royalty M.D.   On: 09/22/2017 18:51    ____________________________________________   PROCEDURES Procedures  ____________________________________________   DIFFERENTIAL DIAGNOSIS  Bronchitis, pneumothorax, pneumonia, pulmonary embolism, aortic dissection, pericarditis  CLINICAL IMPRESSION / ASSESSMENT AND PLAN / ED COURSE  Pertinent labs & imaging results that were available during my care of the patient were reviewed by me and considered in  my medical decision making (see chart for details).   Patient well-appearing no acute distress, unremarkable vital signs except for low-grade fever. Constellation of symptoms consistent with a viral respiratory illness, but chest x-ray is suggestive of possible poorly defined infiltrate. Given the patient's symptoms, fever, all treated with azithromycin, prednisone, albuterol. D-dimer is negative, I believe he is at low risk of PE given the presentation and no further workup is needed at this time. I doubt ACS dissection or carditis. Patient is not septic.      ____________________________________________   FINAL CLINICAL IMPRESSION(S) / ED DIAGNOSES    Final diagnoses:  Atypical pneumonia  Nonspecific chest  pain      New Prescriptions   ALBUTEROL (PROVENTIL HFA) 108 (90 BASE) MCG/ACT INHALER    Inhale 2 puffs into the lungs every 4 (four) hours as needed for wheezing or shortness of breath.   AZITHROMYCIN (ZITHROMAX Z-PAK) 250 MG TABLET    Take 2 tablets (500 mg) on  Day 1,  followed by 1 tablet (250 mg) once daily on Days 2 through 5.   NAPROXEN (NAPROSYN) 500 MG TABLET    Take 1 tablet (500 mg total) by mouth 2 (two) times daily with a meal.   PREDNISONE (DELTASONE) 20 MG TABLET    Take 2 tablets (40 mg total) by mouth daily.     Portions of this note were generated with dragon dictation software. Dictation errors may occur despite best attempts at proofreading.    Carrie Mew, MD 09/22/17 [phone]

## 2017-09-22 NOTE — ED Triage Notes (Signed)
Patient to ER for c/o chest pain that began today at noon while driving. States he developed shortness of breath, diaphoresis, nausea, and dizziness. Patient ambulatory to triage (declined wheelchair), but visibly dizzy. Patient reports heart disease in several family members at young age. Denies any known personal cardiac history. States pain is worse when he takes a deep breath.

## 2018-01-29 ENCOUNTER — Other Ambulatory Visit: Payer: Self-pay

## 2018-01-29 ENCOUNTER — Emergency Department
Admission: EM | Admit: 2018-01-29 | Discharge: 2018-01-29 | Disposition: A | Discharge status: Home / Self Care | Payer: Self-pay | Attending: Emergency Medicine | Admitting: Emergency Medicine

## 2018-01-29 ENCOUNTER — Emergency Department: Payer: Self-pay

## 2018-01-29 DIAGNOSIS — K5000 Crohn's disease of small intestine without complications: Secondary | ICD-10-CM | POA: Insufficient documentation

## 2018-01-29 DIAGNOSIS — F17210 Nicotine dependence, cigarettes, uncomplicated: Secondary | ICD-10-CM | POA: Insufficient documentation

## 2018-01-29 DIAGNOSIS — R197 Diarrhea, unspecified: Secondary | ICD-10-CM | POA: Insufficient documentation

## 2018-01-29 LAB — COMPREHENSIVE METABOLIC PANEL
ALK PHOS: 53 U/L (ref 38–126)
ALT: 43 U/L (ref 17–63)
ANION GAP: 10 (ref 5–15)
AST: 28 U/L (ref 15–41)
Albumin: 4.3 g/dL (ref 3.5–5.0)
BUN: 19 mg/dL (ref 6–20)
CALCIUM: 8.9 mg/dL (ref 8.9–10.3)
CO2: 19 mmol/L — AB (ref 22–32)
Chloride: 106 mmol/L (ref 101–111)
Creatinine, Ser: 0.91 mg/dL (ref 0.61–1.24)
GFR calc Af Amer: >60 mL/min (ref >60)
GFR calc non Af Amer: >60 mL/min (ref >60)
GLUCOSE: 102 mg/dL — AB (ref 65–99)
Potassium: 4.4 mmol/L (ref 3.5–5.1)
SODIUM: 135 mmol/L (ref 135–145)
Total Bilirubin: 0.9 mg/dL (ref 0.3–1.2)
Total Protein: 7.9 g/dL (ref 6.5–8.1)

## 2018-01-29 LAB — CBC
HEMATOCRIT: 48.7 % (ref 40.0–52.0)
HEMOGLOBIN: 16.6 g/dL (ref 13.0–18.0)
MCH: 30.1 pg (ref 26.0–34.0)
MCHC: 34.2 g/dL (ref 32.0–36.0)
MCV: 88.1 fL (ref 80.0–100.0)
Platelets: 182 10*3/uL (ref 150–440)
RBC: 5.53 MIL/uL (ref 4.40–5.90)
RDW: 12.8 % (ref 11.5–14.5)
WBC: 8.3 10*3/uL (ref 3.8–10.6)

## 2018-01-29 LAB — URINALYSIS, COMPLETE (UACMP) WITH MICROSCOPIC
BACTERIA UA: NONE SEEN
Bilirubin Urine: NEGATIVE
Glucose, UA: NEGATIVE mg/dL
Ketones, ur: NEGATIVE mg/dL
Leukocytes, UA: NEGATIVE
Nitrite: NEGATIVE
PROTEIN: NEGATIVE mg/dL
Specific Gravity, Urine: 1.027 (ref 1.005–1.030)
pH: 5 (ref 5.0–8.0)

## 2018-01-29 LAB — LIPASE, BLOOD: Lipase: 23 U/L (ref 11–51)

## 2018-01-29 MED ORDER — ONDANSETRON HCL 4 MG PO TABS: 4.0000 mg | ORAL_TABLET | Freq: Every day | ORAL | 0 refills | Status: AC | PRN

## 2018-01-29 MED ORDER — AMOXICILLIN-POT CLAVULANATE 875-125 MG PO TABS: 1.0000 | ORAL_TABLET | Freq: Two times a day (BID) | ORAL | 0 refills | Status: AC

## 2018-01-29 MED ORDER — SODIUM CHLORIDE 0.9 % IV BOLUS (SEPSIS)
1000.0000 mL | Freq: Once | INTRAVENOUS | Status: AC
  Administered 2018-01-29: 1000 mL via INTRAVENOUS

## 2018-01-29 MED ORDER — AMOXICILLIN-POT CLAVULANATE 875-125 MG PO TABS
1.0000 | ORAL_TABLET | Freq: Once | ORAL | Status: AC
  Administered 2018-01-29: 1 via ORAL
  Filled 2018-01-29: qty 1

## 2018-01-29 MED ORDER — HYDROCODONE-ACETAMINOPHEN 5-325 MG PO TABS: 1.0000 | ORAL_TABLET | Freq: Four times a day (QID) | ORAL | 0 refills | Status: AC | PRN

## 2018-01-29 MED ORDER — IPRATROPIUM-ALBUTEROL 0.5-2.5 (3) MG/3ML IN SOLN: 3.0000 mL | Freq: Once | RESPIRATORY_TRACT | Status: DC

## 2018-01-29 MED ORDER — IOPAMIDOL (ISOVUE-300) INJECTION 61%
100.0000 mL | Freq: Once | INTRAVENOUS | Status: AC | PRN
  Administered 2018-01-29: 100 mL via INTRAVENOUS

## 2018-01-29 MED ORDER — LOPERAMIDE HCL 2 MG PO CAPS
2.0000 mg | ORAL_CAPSULE | Freq: Once | ORAL | Status: AC
  Administered 2018-01-29: 2 mg via ORAL
  Filled 2018-01-29: qty 1

## 2018-01-29 NOTE — ED Provider Notes (Signed)
[facility] Emergency Department Provider Note  ____________________________________________   First MD Initiated Contact with Patient 01/29/18 1135     (approximate)  I have reviewed the triage vital signs and the nursing notes.   HISTORY  Chief Complaint Abdominal Cramping   HPI Cameron Guzman is a 27 y.o. male who self presents to the emergency department with moderate severity abdominal pain.  His pain began about 5 days ago and has been intermittent.  It seems to be worse with movement somewhat improved with rest.  It initially began diffusely in his abdomen and is now located in his right lower quadrant.  Yesterday he had a temperature of 101.6 degrees.  He had several loose stools although not watery.  He has no history of abdominal surgeries.  He has no cough or coryza.  No sick contacts.  He denies testicular pain.  His pain is clearly improved when lying still and somewhat worsened with movement.  History reviewed. No pertinent past medical history.  Patient Active Problem List   Diagnosis Date Noted  . Snores 08/19/2016  . Compulsive tobacco user syndrome 08/19/2016  . Heart burn 01/19/2016  . Hypersomnolence 01/19/2016  . Dyspepsia 01/19/2016  . Irritability and anger 01/19/2016  . Tobacco use 01/19/2016    Past Surgical History:  Procedure Laterality Date  . TONSILLECTOMY AND ADENOIDECTOMY      Prior to Admission medications   Medication Sig Start Date End Date Taking? Authorizing Provider  albuterol (PROVENTIL HFA) 108 (90 Base) MCG/ACT inhaler Inhale 2 puffs into the lungs every 4 (four) hours as needed for wheezing or shortness of breath. 09/22/17   Carrie Mew, MD  amoxicillin-clavulanate (AUGMENTIN) 875-125 MG tablet Take 1 tablet by mouth 2 (two) times daily for 14 days. 01/29/18 02/12/18  Darel Hong, MD  azithromycin (ZITHROMAX Z-PAK) 250 MG tablet Take 2 tablets (500 mg) on  Day 1,  followed by 1 tablet (250 mg) once  daily on Days 2 through 5. Patient not taking: Reported on 01/29/2018 09/22/17   Carrie Mew, MD  cyclobenzaprine (FLEXERIL) 10 MG tablet Take 1 tablet (10 mg total) by mouth 3 (three) times daily. Patient not taking: Reported on 01/29/2018 08/07/17   Johnn Hai, PA-C  HYDROcodone-acetaminophen [facility]) 5-325 MG tablet Take 1 tablet by mouth every 6 (six) hours as needed for up to 15 doses for severe pain. 01/29/18   Darel Hong, MD  naproxen (NAPROSYN) 500 MG tablet Take 1 tablet (500 mg total) by mouth 2 (two) times daily with a meal. Patient not taking: Reported on 01/29/2018 09/22/17   Carrie Mew, MD  ondansetron (ZOFRAN ODT) 4 MG disintegrating tablet 11m ODT q4 hours prn nausea/vomit Patient not taking: Reported on 08/26/2016 08/16/16   YDrenda Freeze MD  ondansetron (ZOFRAN) 4 MG tablet Take 1 tablet (4 mg total) by mouth daily as needed. 01/29/18 01/29/19  RDarel Hong MD  oxyCODONE-acetaminophen (ROXICET) 5-325 MG tablet Take 1 tablet by mouth every 6 (six) hours as needed. Patient not taking: Reported on 01/29/2018 08/07/17 08/07/18  SJohnn Hai PA-C  predniSONE (DELTASONE) 20 MG tablet Take 2 tablets (40 mg total) by mouth daily. Patient not taking: Reported on 01/29/2018 09/22/17   SCarrie Mew MD    Allergies Other  Family History  Problem Relation Age of Onset  . Anxiety disorder Mother   . Depression Mother   . Heart disease Father   . Hypertension Father     Social History Social History  Tobacco Use  . Smoking status: Current Every Day Smoker    Packs/day: 1.00    Years: 13.00    Pack years: 13.00    Types: Cigarettes  . Smokeless tobacco: Never Used  Substance Use Topics  . Alcohol use: No  . Drug use: No    Review of Systems Constitutional: No fever/chills Eyes: No visual changes. ENT: No sore throat. Cardiovascular: Denies chest pain. Respiratory: Denies shortness of breath. Gastrointestinal: Positive for abdominal pain.   Positive for nausea, no vomiting.  Positive for diarrhea.  No constipation. Genitourinary: Negative for dysuria. Musculoskeletal: Negative for back pain. Skin: Negative for rash. Neurological: Negative for headaches, focal weakness or numbness.   ____________________________________________   PHYSICAL EXAM:  VITAL SIGNS: ED Triage Vitals [01/29/18 1045]  Enc Vitals Group     BP 109/72     Pulse Rate 78     Resp 18     Temp 99.2 F (37.3 C)     Temp Source Oral     SpO2 98 %     Weight 276 lb (125.2 kg)     Height 5' 11"  (1.803 m)     Head Circumference      Peak Flow      Pain Score 5     Pain Loc      Pain Edu?      Excl. in [place]?     Constitutional: Alert and oriented x4 appears uncomfortable nontoxic no diaphoresis speaks in full clear sentences Eyes: PERRL EOMI. Head: Atraumatic. Nose: No congestion/rhinnorhea. Mouth/Throat: No trismus Neck: No stridor.   Cardiovascular: Normal rate, regular rhythm. Grossly normal heart sounds.  Good peripheral circulation. Respiratory: Normal respiratory effort.  No retractions. Lungs CTAB and moving good air Gastrointestinal: Obese soft quite tender right lower quadrant over McBurney's with rebound and guarding positive Rovsing's Musculoskeletal: No lower extremity edema   Neurologic:  Normal speech and language. No gross focal neurologic deficits are appreciated. Skin:  Skin is warm, dry and intact. No rash noted. Psychiatric: Mood and affect are normal. Speech and behavior are normal.    ____________________________________________   DIFFERENTIAL includes but not limited to  Appendicitis, diverticulitis, testicular torsion, viral gastroenteritis, bacterial gastroenteritis, influenza ____________________________________________   LABS (all labs ordered are listed, but only abnormal results are displayed)  Labs Reviewed  COMPREHENSIVE METABOLIC PANEL - Abnormal; Notable for the following components:      Result Value    CO2 19 (*)    Glucose, Bld 102 (*)    All other components within normal limits  URINALYSIS, COMPLETE (UACMP) WITH MICROSCOPIC - Abnormal; Notable for the following components:   Color, Urine YELLOW (*)    APPearance HAZY (*)    Hgb urine dipstick MODERATE (*)    Squamous Epithelial / LPF 0-5 (*)    All other components within normal limits  LIPASE, BLOOD  CBC    Lab work reviewed by me with low CO2 concerning for infection __________________________________________  EKG  ____________________________________________  RADIOLOGY  CT scan of the abdomen pelvis reviewed by me concerning for terminal ileitis.  Specifically there is a normal appendix visualized ____________________________________________   PROCEDURES  Procedure(s) performed: no  Procedures  Critical Care performed: no  Observation: no ____________________________________________   INITIAL IMPRESSION / ASSESSMENT AND PLAN / ED COURSE  Pertinent labs & imaging results that were available during my care of the patient were reviewed by me and considered in my medical decision making (see chart for details).  Patient arrives febrile  at home along with progressive abdominal pain and is now quite tender in his right lower quadrant.  Lab work is reassuring although given his exam and concern for appendicitis and he requires a CT scan with IV contrast.  Fortunately the patient's CT scan shows a normal appendix.  It does show some edema around his terminal ileum raising concern for inflammatory bowel disease versus terminal ileitis.  I will give him a trial of Augmentin but more importantly have him follow-up with gastroenterology this coming week for further evaluation and possible colonoscopy.  Strict return precautions have been given and the patient verbalizes understanding and agreement with the plan.      ____________________________________________   FINAL CLINICAL IMPRESSION(S) / ED DIAGNOSES  Final  diagnoses:  Terminal ileitis without complication ([place])  Diarrhea of presumed infectious origin      NEW MEDICATIONS STARTED DURING THIS VISIT:  Discharge Medication List as of 01/29/2018 12:51 PM    START taking these medications   Details  amoxicillin-clavulanate (AUGMENTIN) 875-125 MG tablet Take 1 tablet by mouth 2 (two) times daily for 14 days., Starting Thu 01/29/2018, Until Thu 02/12/2018, Print    HYDROcodone-acetaminophen (NORCO) 5-325 MG tablet Take 1 tablet by mouth every 6 (six) hours as needed for up to 15 doses for severe pain., Starting Thu 01/29/2018, Print    ondansetron (ZOFRAN) 4 MG tablet Take 1 tablet (4 mg total) by mouth daily as needed., Starting Thu 01/29/2018, Until Fri 01/29/2019, Print         Note:  This document was prepared using Dragon voice recognition software and may include unintentional dictation errors.     Darel Hong, MD 01/29/18 1504

## 2018-01-29 NOTE — ED Notes (Signed)
First Nurse Note:  Patient from [facility] via [facility] with abdominal pain.

## 2018-01-29 NOTE — ED Notes (Signed)
Patient transported to CT 

## 2018-01-29 NOTE — ED Triage Notes (Signed)
Pt c/o abd cramping with N/V/D/fever since Sunday.

## 2018-01-29 NOTE — Discharge Instructions (Signed)
Please take all of your antibiotics as prescribed and use your pain medication only as needed for severe symptoms.  Please make an appointment to follow-up with the GI specialist this coming week for reevaluation.  Return to the emergency department sooner for any new or worsening symptoms such as fevers, chills, worsening pain, if you cannot eat or drink, or for any other issues whatsoever.  It was a pleasure to take care of you today, and thank you for coming to our emergency department.  If you have any questions or concerns before leaving please ask the nurse to grab me and I'm more than happy to go through your aftercare instructions again.  If you were prescribed any opioid pain medication today such as Norco, Vicodin, Percocet, morphine, hydrocodone, or oxycodone please make sure you do not drive when you are taking this medication as it can alter your ability to drive safely.  If you have any concerns once you are home that you are not improving or are in fact getting worse before you can make it to your follow-up appointment, please do not hesitate to call [phone] and come back for further evaluation.  Darel Hong, MD  Results for orders placed or performed during the hospital encounter of 01/29/18  Lipase, blood  Result Value Ref Range   Lipase 23 11 - 51 U/L  Comprehensive metabolic panel  Result Value Ref Range   Sodium 135 135 - 145 mmol/L   Potassium 4.4 3.5 - 5.1 mmol/L   Chloride 106 101 - 111 mmol/L   CO2 19 (L) 22 - 32 mmol/L   Glucose, Bld 102 (H) 65 - 99 mg/dL   BUN 19 6 - 20 mg/dL   Creatinine, Ser 0.91 0.61 - 1.24 mg/dL   Calcium 8.9 8.9 - 10.3 mg/dL   Total Protein 7.9 6.5 - 8.1 g/dL   Albumin 4.3 3.5 - 5.0 g/dL   AST 28 15 - 41 U/L   ALT 43 17 - 63 U/L   Alkaline Phosphatase 53 38 - 126 U/L   Total Bilirubin 0.9 0.3 - 1.2 mg/dL   GFR calc non Af Amer >60 >60 mL/min   GFR calc Af Amer >60 >60 mL/min   Anion gap 10 5 - 15  CBC  Result Value Ref Range   WBC 8.3  3.8 - 10.6 K/uL   RBC 5.53 4.40 - 5.90 MIL/uL   Hemoglobin 16.6 13.0 - 18.0 g/dL   HCT 48.7 40.0 - 52.0 %   MCV 88.1 80.0 - 100.0 fL   MCH 30.1 26.0 - 34.0 pg   MCHC 34.2 32.0 - 36.0 g/dL   RDW 12.8 11.5 - 14.5 %   Platelets 182 150 - 440 K/uL  Urinalysis, Complete w Microscopic  Result Value Ref Range   Color, Urine YELLOW (A) YELLOW   APPearance HAZY (A) CLEAR   Specific Gravity, Urine 1.027 1.005 - 1.030   pH 5.0 5.0 - 8.0   Glucose, UA NEGATIVE NEGATIVE mg/dL   Hgb urine dipstick MODERATE (A) NEGATIVE   Bilirubin Urine NEGATIVE NEGATIVE   Ketones, ur NEGATIVE NEGATIVE mg/dL   Protein, ur NEGATIVE NEGATIVE mg/dL   Nitrite NEGATIVE NEGATIVE   Leukocytes, UA NEGATIVE NEGATIVE   RBC / HPF 0-5 0 - 5 RBC/hpf   WBC, UA 0-5 0 - 5 WBC/hpf   Bacteria, UA NONE SEEN NONE SEEN   Squamous Epithelial / LPF 0-5 (A) NONE SEEN   Mucus PRESENT    Ct Abdomen Pelvis W  Contrast  Result Date: 01/29/2018 CLINICAL DATA:  Cramping abdominal pain with nausea, vomiting, diarrhea and fever. EXAM: CT ABDOMEN AND PELVIS WITH CONTRAST TECHNIQUE: Multidetector CT imaging of the abdomen and pelvis was performed using the standard protocol following bolus administration of intravenous contrast. CONTRAST:  186m ISOVUE-300 IOPAMIDOL (ISOVUE-300) INJECTION 61% COMPARISON:  None. FINDINGS: Lower chest: 4 mm subpleural left lower lobe nodule, likely a subpleural lymph node. Heart size normal. No pericardial or pleural effusion. Distal esophagus is unremarkable. Hepatobiliary: Liver and gallbladder are unremarkable. No biliary ductal dilatation. Pancreas: Negative. Spleen: Negative. Adrenals/Urinary Tract: Adrenal glands and kidneys are unremarkable. Ureters are decompressed. Bladder is grossly unremarkable. Stomach/Bowel: Stomach and majority of the small bowel are unremarkable. There is fairly significant wall thickening and hyper attenuation involving the terminal ileum (series 2, images 62-72). Multiple adjacent small  to borderline enlarged ileocolic lymph nodes, measuring up to 10 mm. Appendix is normal. Fluid is seen in the ascending colon. Colon is otherwise unremarkable. Vascular/Lymphatic: Vascular structures are unremarkable. Periportal lymph nodes measure up to 11 mm. There is small bowel mesenteric haziness and nodularity, measuring up to 10 mm. Ileocolic mesenteric lymph nodes are again mentioned, measuring up to 10 mm. Abdominal retroperitoneal lymph nodes are subcentimeter in short axis size. Reproductive: Prostate is visualized. Other: No free fluid. Mesenteric haziness and nodularity is discussed above. Mesenteries and peritoneum are otherwise unremarkable. Musculoskeletal: Negative. IMPRESSION: Thickening and mild hyperattenuation involving the terminal ileum with ileocolic mesenteric adenopathy, findings worrisome for inflammatory bowel disease. An infectious etiology is not entirely excluded. Electronically Signed   By: MLorin PicketM.D.   On: 01/29/2018 12:31   Unfortunately prescriptions medications can be very expensive.  Please consider [facility] as they have a number of medications that are $4 for a 30 day supply.  [email or web address]  Another great option is www.goodrx.com which can help you find the most affordable prices around you.

## 2018-02-04 ENCOUNTER — Ambulatory Visit: Payer: Self-pay | Admitting: Gastroenterology

## 2018-02-21 ENCOUNTER — Encounter: Payer: Self-pay | Admitting: Physician Assistant

## 2018-12-09 ENCOUNTER — Encounter: Payer: Self-pay | Admitting: *Deleted

## 2018-12-14 IMAGING — CR DG CHEST 2V
2 series · 2 of 2 positions shown · non-contrast
Comparison: None.

CLINICAL DATA: Left-sided chest pain radiating to the left arm.
Current smoker. Dyspnea.

EXAM:
CHEST  2 VIEW

[chest pa]
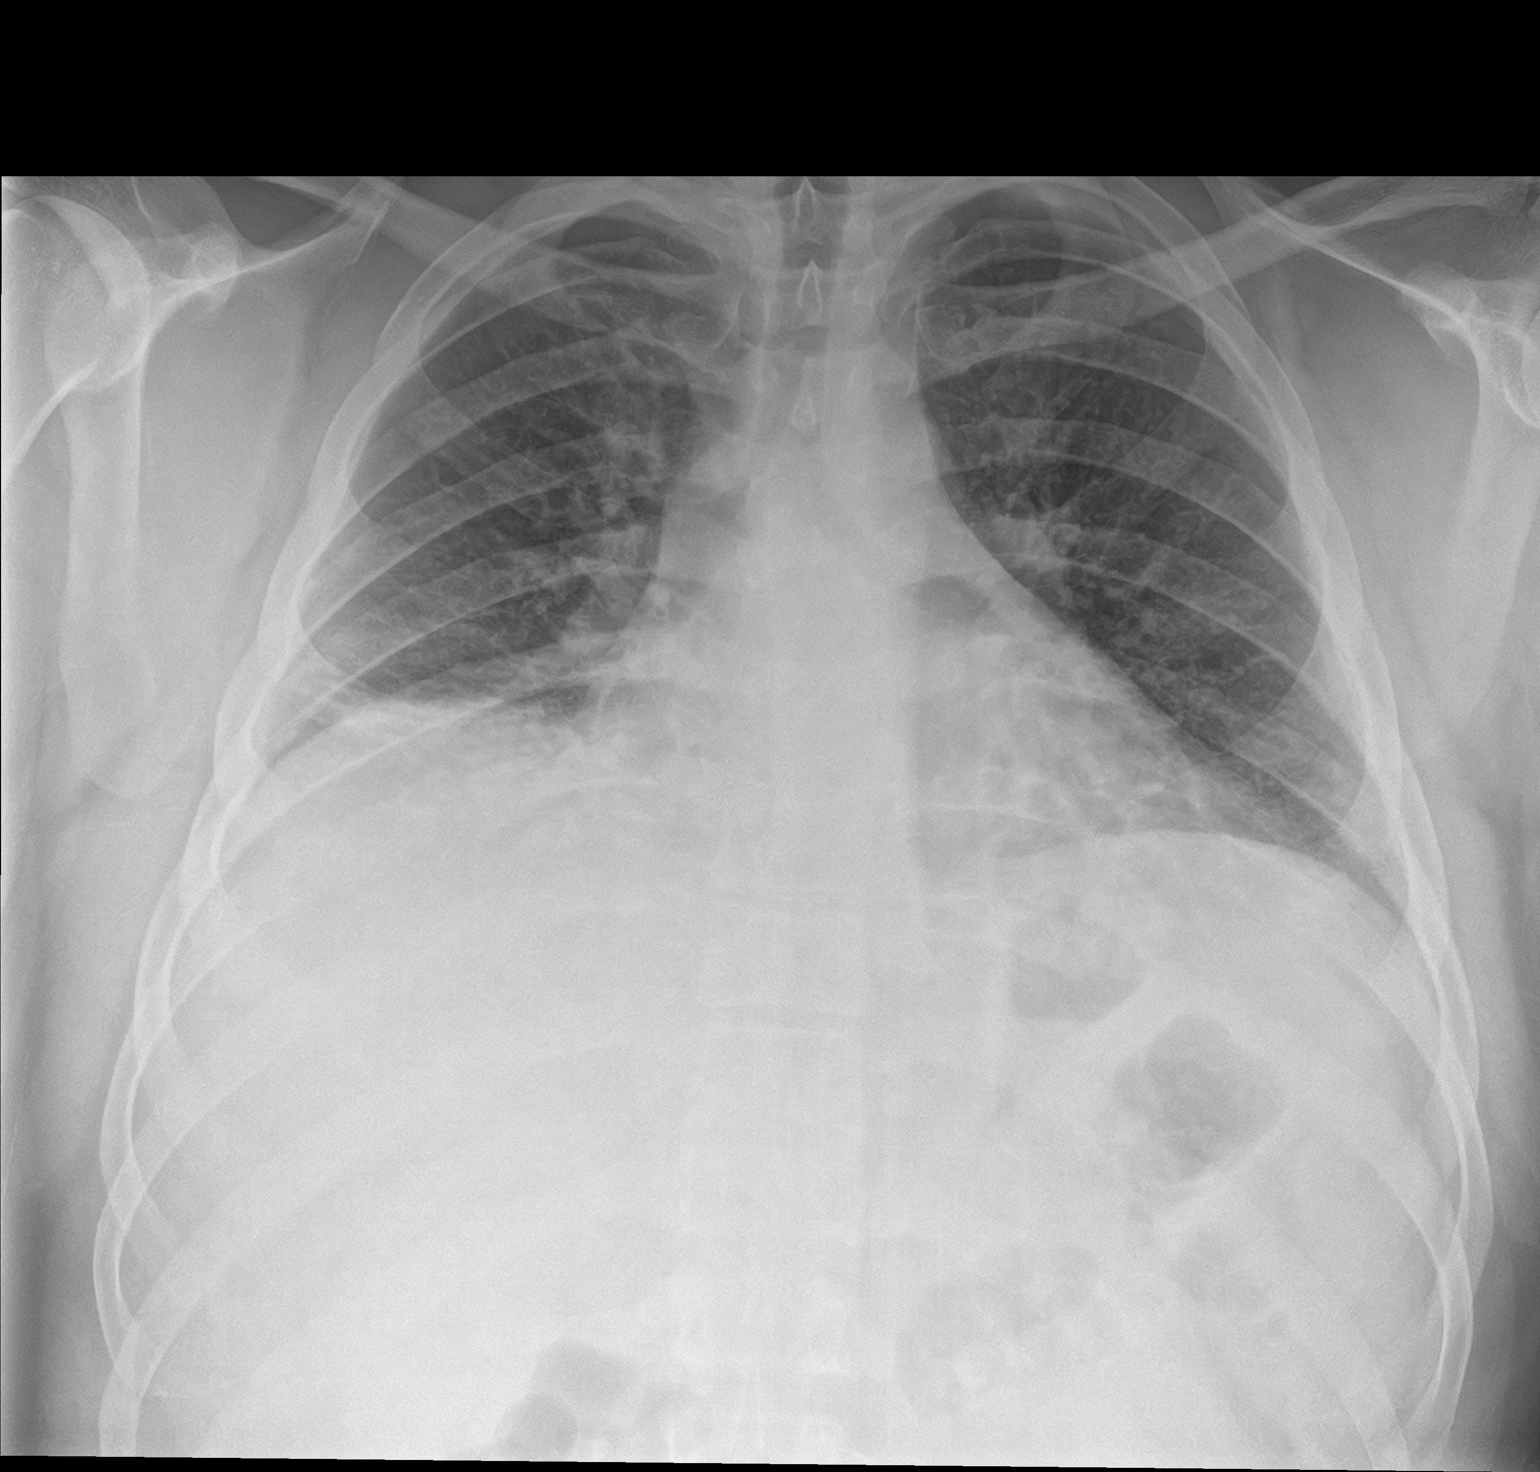

[chest lat]
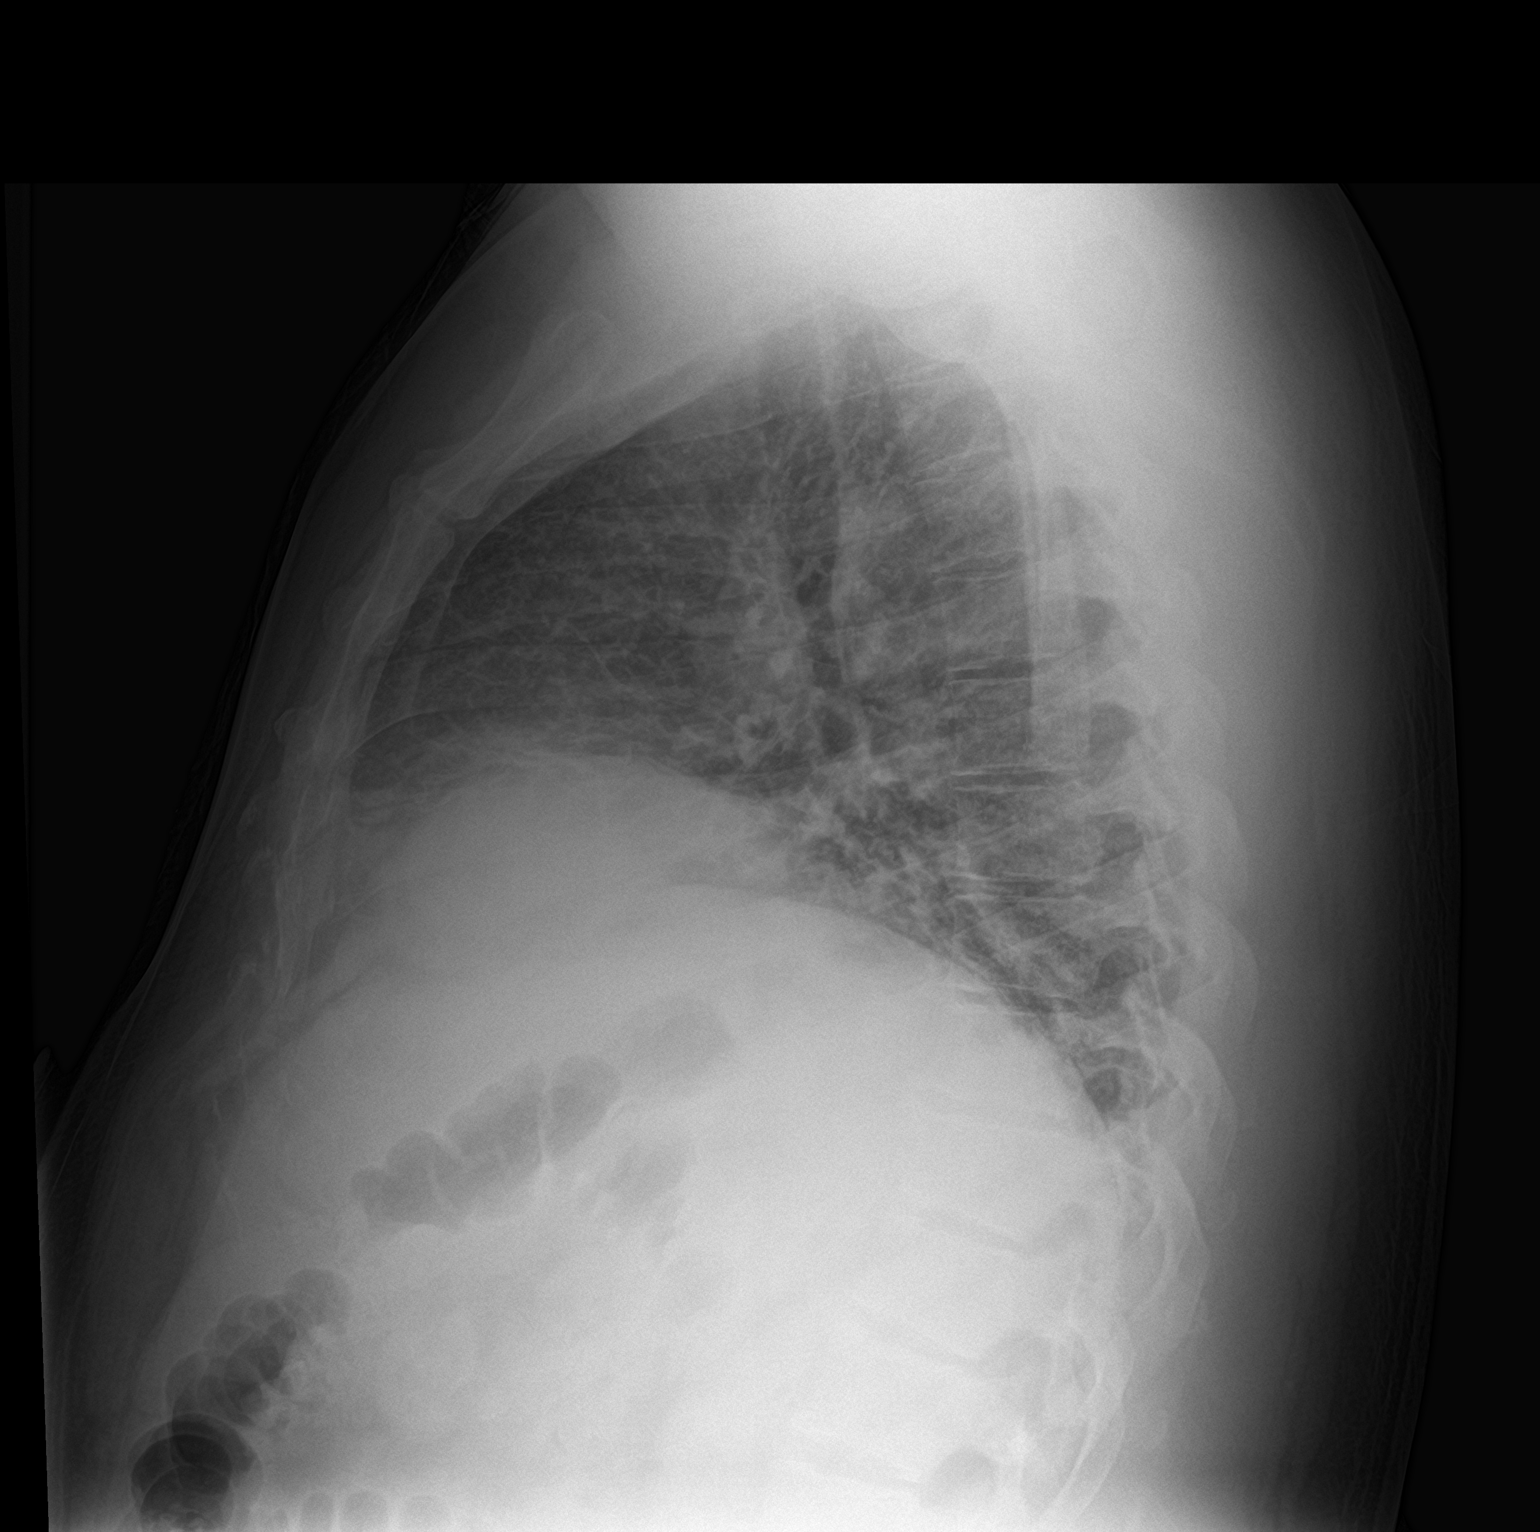

[2 of 2 positions shown; findings below may reference images not displayed]

FINDINGS: Heart is top-normal in size. No aortic aneurysm. Mild eventration of
right hemidiaphragm. Atelectasis is noted at each lung base.
Superimposed right middle lobe pneumonia would be difficult to
entirely exclude given increased density in the right middle lobe
distribution on the lateral view. However, most of the opacity is
believed to be secondary to eventration of the right hemidiaphragm.
No effusion nor overt pulmonary edema. Mild increase in interstitial
prominence bilaterally may reflect chronic bronchitic change. No
acute nor suspicious osseous abnormality.
IMPRESSION: 1. Increased interstitial lung markings may reflect chronic
bronchitic change.
2. Bibasilar atelectasis is noted right greater than left. A
superimposed right middle lobe pneumonia would be difficult to
entirely exclude given relative increase in opacity on the lateral
view. Most of this however is believed to be due to eventration of
the diaphragm.

## 2019-04-22 IMAGING — CT CT ABD-PELV W/ CM
2 of 4 series · 16 of 46 positions shown, 18 images · IV contrast (APPLIED)
Comparison: None.

CLINICAL DATA: Cramping abdominal pain with nausea, vomiting,
diarrhea and fever.

EXAM:
CT ABDOMEN AND PELVIS WITH CONTRAST
TECHNIQUE: Multidetector CT imaging of the abdomen and pelvis was performed
using the standard protocol following bolus administration of
intravenous contrast.
CONTRAST:  100mL [id number] IOPAMIDOL (BYFFZ3-7NN) INJECTION 61%

[Series 2: routine abd/pel with · axial · 0.77mm/px · z∈[-706,-236]mm · 13 of 104 slices shown, 15 images]
[im 5/104  soft-tissue]
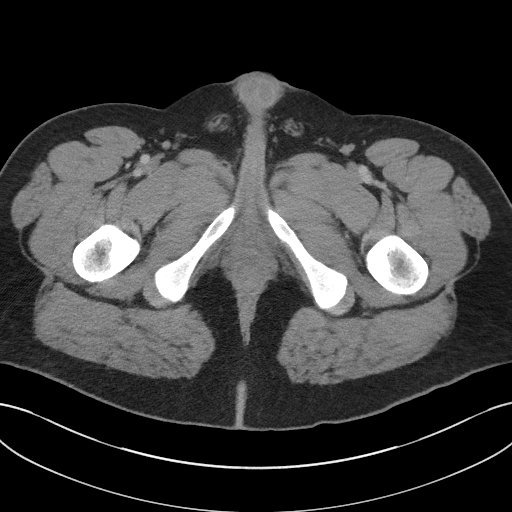
[im 5/104  bone]
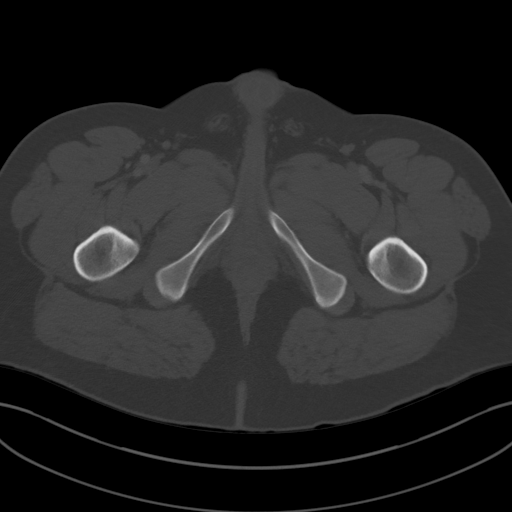
[im 14/104  soft-tissue]
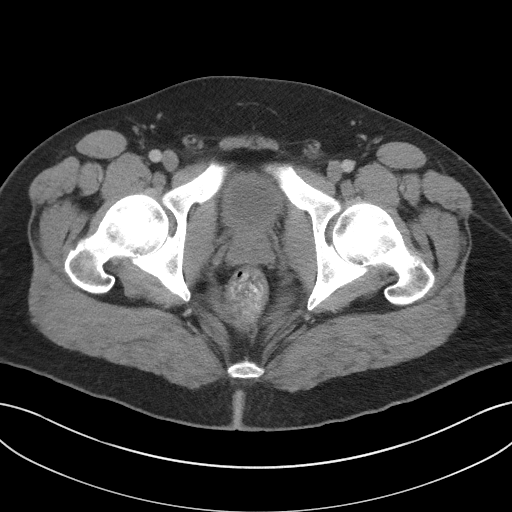
[im 23/104  soft-tissue]
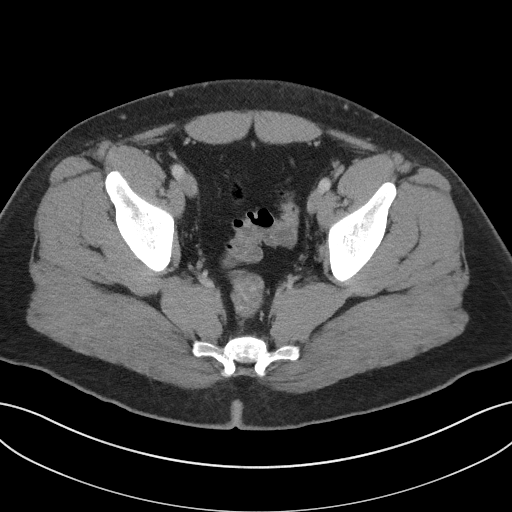
[im 27/104  soft-tissue]
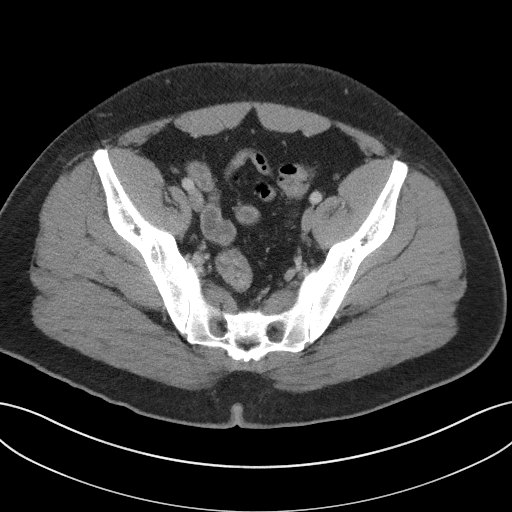
[im 36/104  soft-tissue]
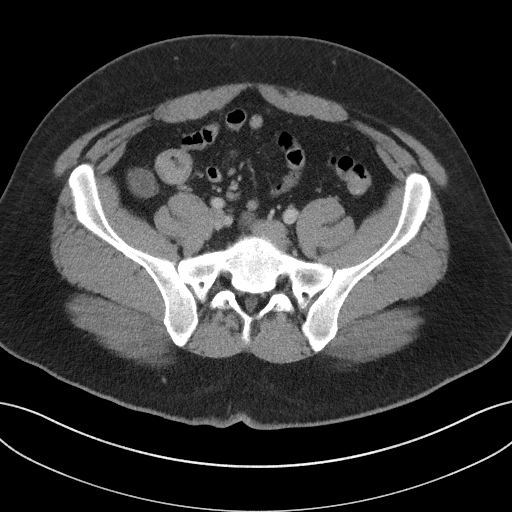
[im 45/104  soft-tissue]
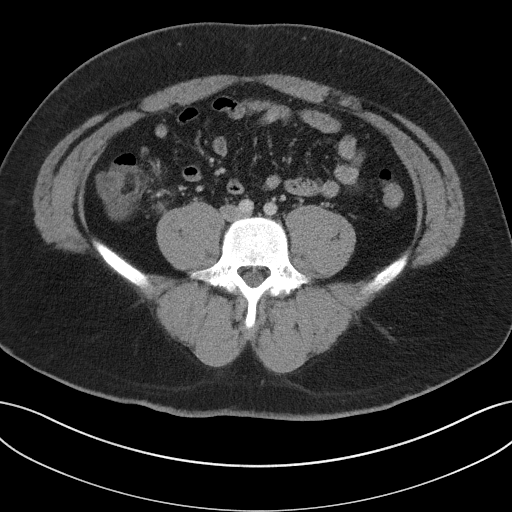
[im 54/104  soft-tissue]
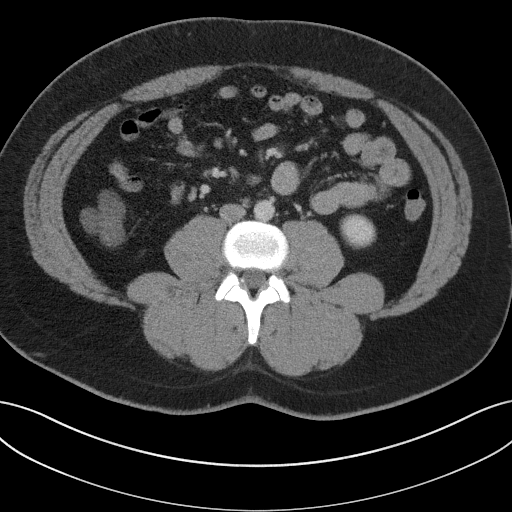
[im 59/104  soft-tissue]
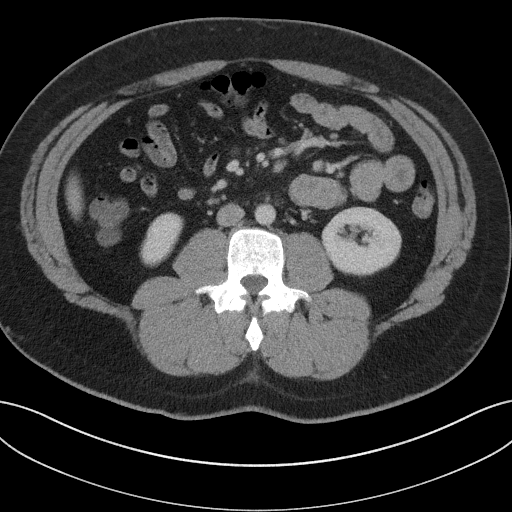
[im 68/104  soft-tissue]
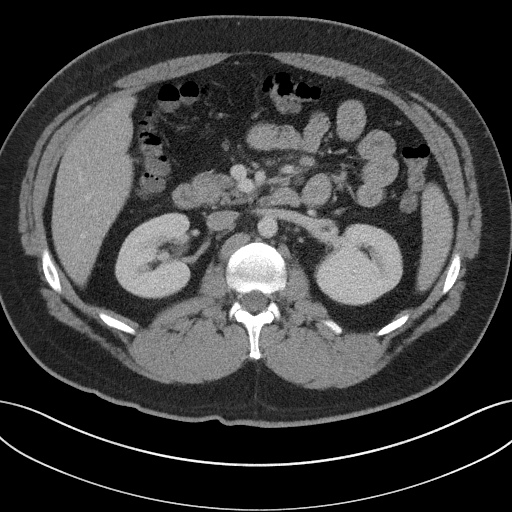
[im 68/104  bone]
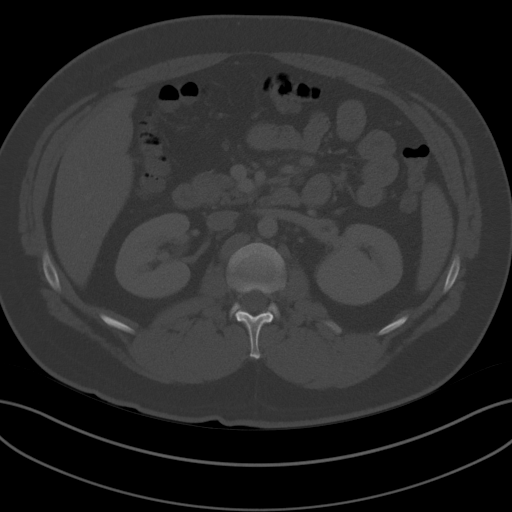
[im 77/104  soft-tissue]
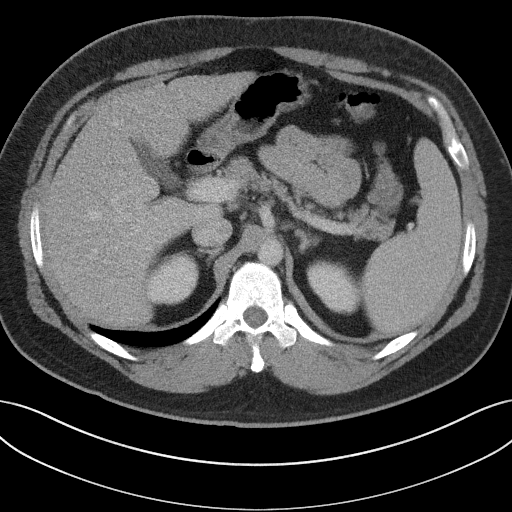
[im 81/104  soft-tissue]
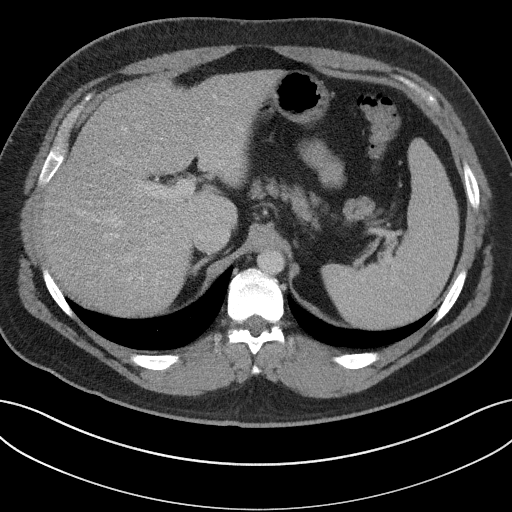
[im 90/104  soft-tissue]
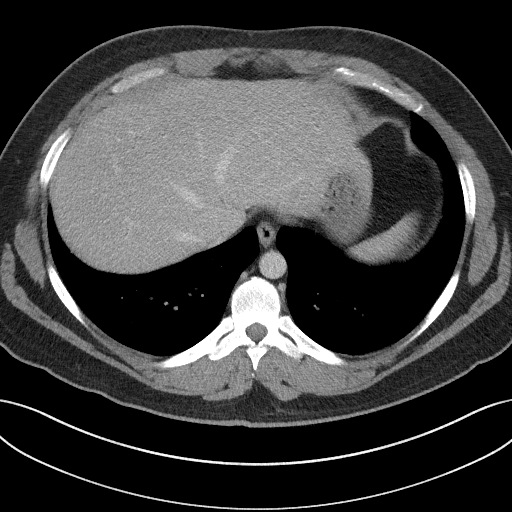
[im 99/104  soft-tissue]
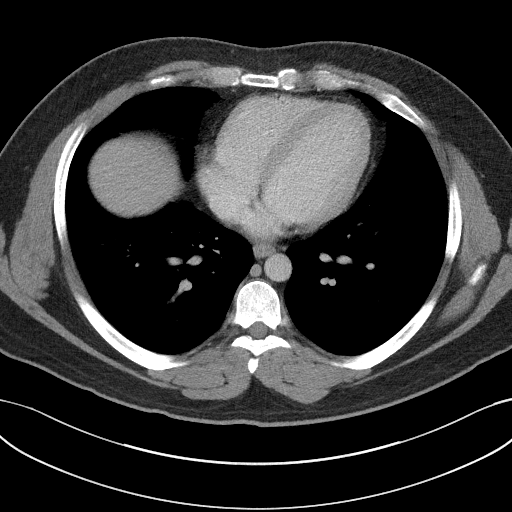

[Series 5: coronal st · coronal · 0.78mm/px · 3 of 95 slices shown]
[im 32/95  soft-tissue]
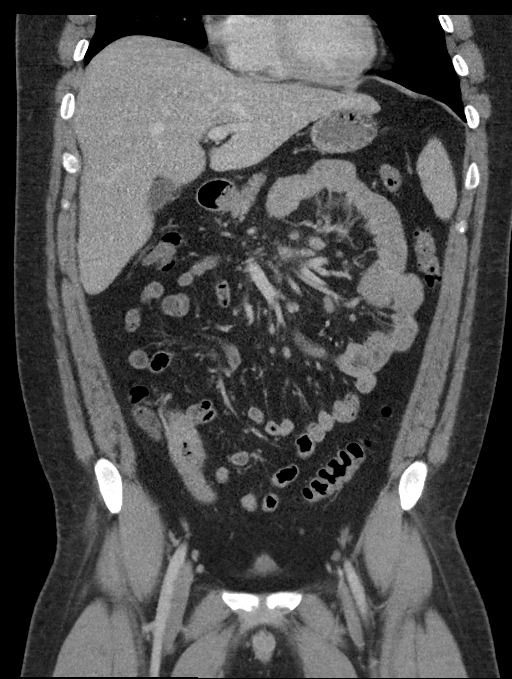
[im 42/95  soft-tissue]
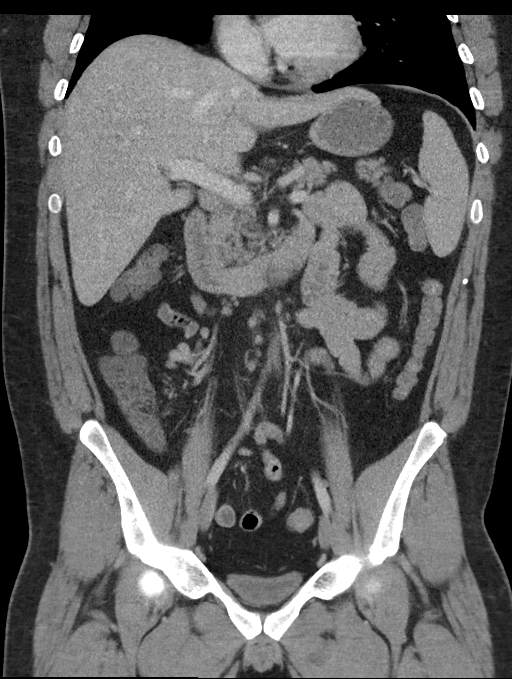
[im 53/95  soft-tissue]
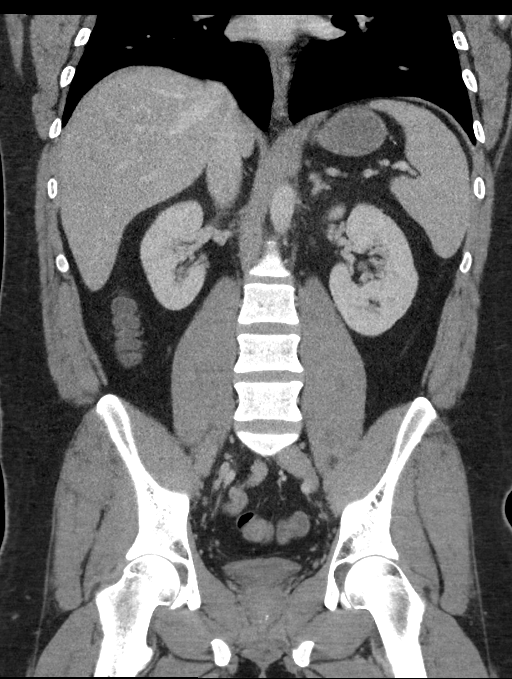

[16 of 46 positions shown; findings below may reference images not displayed]

FINDINGS: Lower chest: 4 mm subpleural left lower lobe nodule, likely a
subpleural lymph node. Heart size normal. No pericardial or pleural
effusion. Distal esophagus is unremarkable.

Hepatobiliary: Liver and gallbladder are unremarkable. No biliary
ductal dilatation.

Pancreas: Negative.

Spleen: Negative.

Adrenals/Urinary Tract: Adrenal glands and kidneys are unremarkable.
Ureters are decompressed. Bladder is grossly unremarkable.

Stomach/Bowel: Stomach and majority of the small bowel are
unremarkable. There is fairly significant wall thickening and hyper
attenuation involving the terminal ileum (series 2, images 62-72).
Multiple adjacent small to borderline enlarged ileocolic lymph
nodes, measuring up to 10 mm. Appendix is normal. Fluid is seen in
the ascending colon. Colon is otherwise unremarkable.

Vascular/Lymphatic: Vascular structures are unremarkable. Periportal
lymph nodes measure up to 11 mm. There is small bowel mesenteric
haziness and nodularity, measuring up to 10 mm. Ileocolic mesenteric
lymph nodes are again mentioned, measuring up to 10 mm. Abdominal
retroperitoneal lymph nodes are subcentimeter in short axis size.

Reproductive: Prostate is visualized.

Other: No free fluid. Mesenteric haziness and nodularity is
discussed above. Mesenteries and peritoneum are otherwise
unremarkable.

Musculoskeletal: Negative.
IMPRESSION: Thickening and mild hyperattenuation involving the terminal ileum
with ileocolic mesenteric adenopathy, findings worrisome for
inflammatory bowel disease. An infectious etiology is not entirely
excluded.

## 2020-03-30 ENCOUNTER — Telehealth: Payer: Self-pay

## 2020-03-30 NOTE — Telephone Encounter (Signed)
08/26/16 was when he was referred to allergist for the symptoms. That is not exact diagnosis time but when he started developing symptoms.

## 2020-03-30 NOTE — Telephone Encounter (Signed)
Copied from [place] [id number]. Topic: General - Other >> Mar 30, 2020  1:59 PM Alanda Slim E wrote: Reason for CRM: Pts wife called to find out when the Pt was diagnosed with Alpha Gal / please call and advise

## 2020-03-30 NOTE — Telephone Encounter (Signed)
Patient advised.
